# Patient Record
Sex: Male | Born: 1965 | Race: Black or African American | Hispanic: No | Marital: Married | Smoking: Never smoker
Health system: Southern US, Community
[De-identification: ages and names within clinical notes are randomized; demographics above are authoritative.]

## PROBLEM LIST (undated history)

## (undated) DIAGNOSIS — E119 Type 2 diabetes mellitus without complications: Secondary | ICD-10-CM

## (undated) DIAGNOSIS — R569 Unspecified convulsions: Secondary | ICD-10-CM

## (undated) DIAGNOSIS — E785 Hyperlipidemia, unspecified: Secondary | ICD-10-CM

## (undated) DIAGNOSIS — J45909 Unspecified asthma, uncomplicated: Secondary | ICD-10-CM

## (undated) DIAGNOSIS — D320 Benign neoplasm of cerebral meninges: Secondary | ICD-10-CM

## (undated) HISTORY — PX: OTHER SURGICAL HISTORY: SHX169

---

## 2013-05-31 ENCOUNTER — Encounter (HOSPITAL_COMMUNITY): Payer: Self-pay | Admitting: Emergency Medicine

## 2013-05-31 ENCOUNTER — Emergency Department (INDEPENDENT_AMBULATORY_CARE_PROVIDER_SITE_OTHER)
Admission: EM | Admit: 2013-05-31 | Discharge: 2013-05-31 | Disposition: A | Discharge status: Home / Self Care | Payer: Self-pay | Source: Home / Self Care

## 2013-05-31 DIAGNOSIS — T148XXA Other injury of unspecified body region, initial encounter: Secondary | ICD-10-CM

## 2013-05-31 DIAGNOSIS — R51 Headache: Secondary | ICD-10-CM

## 2013-05-31 DIAGNOSIS — R519 Headache, unspecified: Secondary | ICD-10-CM
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
## 2013-06-02 NOTE — ED Provider Notes (Signed)
Medical screening examination/treatment/procedure(s) were performed by a resident physician or non-physician practitioner and as the supervising physician I was immediately available for consultation/collaboration.  Clementeen GrahamEvan Akita Maxim, MD   Rodolph BongEvan S Zamauri Nez, MD 06/02/13 [phone]

## 2013-06-12 ENCOUNTER — Emergency Department (HOSPITAL_COMMUNITY)
Admission: EM | Admit: 2013-06-12 | Discharge: 2013-06-12 | Disposition: A | Discharge status: Home / Self Care | Payer: 59 | Source: Home / Self Care

## 2013-06-12 ENCOUNTER — Encounter (HOSPITAL_COMMUNITY): Payer: Self-pay | Admitting: Emergency Medicine

## 2013-06-12 DIAGNOSIS — R059 Cough, unspecified: Secondary | ICD-10-CM

## 2013-06-12 DIAGNOSIS — R05 Cough: Secondary | ICD-10-CM

## 2013-06-12 DIAGNOSIS — R0982 Postnasal drip: Secondary | ICD-10-CM

## 2013-06-12 MED ORDER — GUAIFENESIN-CODEINE 100-10 MG/5ML PO SYRP
5.0000 mL | ORAL_SOLUTION | ORAL | Status: DC | PRN
Start: 2013-06-12 — End: 2019-04-09

## 2013-06-12 MED ORDER — ALBUTEROL SULFATE HFA 108 (90 BASE) MCG/ACT IN AERS: 1.0000 | INHALATION_SPRAY | Freq: Four times a day (QID) | RESPIRATORY_TRACT | Status: AC | PRN

## 2013-06-12 NOTE — ED Notes (Signed)
Pt  Reports  Symptoms  Of  A  Non  Productive  Cough  With  The  Symptoms   X  1  Week       He  Reports  Some   Sinus  Congestion  As  Well   Symptoms  Not  releived  By  otc  meds

## 2013-06-12 NOTE — ED Provider Notes (Signed)
  CSN: [id number]     Arrival date & time 06/12/13  1209 History   First MD Initiated Contact with Patient 06/12/13 1402     Chief Complaint  Patient presents with  . Cough   (Consider location/radiation/quality/duration/timing/severity/associated sxs/prior Treatment) HPI Comments: Cough for 2 weeks. Early AM with cough and expectorating mucous. Cough worse at night when supine. No fever or dyspnea.   History reviewed. No pertinent past medical history. History reviewed. No pertinent past surgical history. History reviewed. No pertinent family history. History  Substance Use Topics  . Smoking status: Never Smoker   . Smokeless tobacco: Not on file  . Alcohol Use: No    Review of Systems  Constitutional: Negative for fever, diaphoresis, activity change and fatigue.  HENT: Positive for postnasal drip. Negative for ear pain, facial swelling, rhinorrhea, sore throat and trouble swallowing.   Eyes: Negative for pain, discharge and redness.  Respiratory: Positive for cough. Negative for chest tightness and shortness of breath.   Cardiovascular: Negative.   Gastrointestinal: Negative.   Musculoskeletal: Negative.  Negative for neck pain and neck stiffness.  Neurological: Negative.     Allergies  Review of patient's allergies indicates no known allergies.  Home Medications   Prior to Admission medications   Medication Sig Start Date End Date Taking? Authorizing Provider  ibuprofen (ADVIL,MOTRIN) 200 MG tablet Take 200 mg by mouth every 6 (six) hours as needed for headache.    Historical Provider, MD   BP 146/88  Pulse 98  Resp 17  SpO2 99% Physical Exam  Nursing note and vitals reviewed. Constitutional: He is oriented to person, place, and time. He appears well-developed and well-nourished. No distress.  HENT:  Mouth/Throat: No oropharyngeal exudate.  TM's nl OP with cobblestoning and clear PND  Eyes: Conjunctivae and EOM are normal.  Neck: Normal range of motion. Neck  supple.  Cardiovascular: Normal rate and regular rhythm.   Pulmonary/Chest: Effort normal and breath sounds normal. No respiratory distress. He has no wheezes. He has no rales.  Exp phase with slight prolongation.  Musculoskeletal: Normal range of motion. He exhibits no edema.  Lymphadenopathy:    He has no cervical adenopathy.  Neurological: He is alert and oriented to person, place, and time.  Skin: Skin is warm and dry. No rash noted.  Psychiatric: He has a normal mood and affect.    ED Course  Procedures (including critical care time) Labs Review Labs Reviewed - No data to display  Imaging Review No results found.   MDM   1. Cough   2. PND (post-nasal drip)      Cough most likely due to PND, also consider occult bronchospasm. cheratussin AC Allegra or chlortimeton aths Albuterol HFAas dir.    Hayden Rasmussenavid Jasyah Theurer, NP 06/12/13 1426

## 2013-06-12 NOTE — Discharge Instructions (Signed)
Cough, Adult  A cough is a reflex. It helps you clear your throat and airways. A cough can help heal your body. A cough can last 2 or 3 weeks (acute) or may last more than 8 weeks (chronic). Some common causes of a cough can include an infection, allergy, or a cold. HOME CARE  Only take medicine as told by your doctor.  If given, take your medicines (antibiotics) as told. Finish them even if you start to feel better.  Use a cold steam vaporizer or humidier in your home. This can help loosen thick spit (secretions).  Sleep so you are almost sitting up (semi-upright). Use pillows to do this. This helps reduce coughing.  Rest as needed.  Stop smoking if you smoke. GET HELP RIGHT AWAY IF:  You have yellowish-white fluid (pus) in your thick spit.  Your cough gets worse.  Your medicine does not reduce coughing, and you are losing sleep.  You cough up blood.  You have trouble breathing.  Your pain gets worse and medicine does not help.  You have a fever. MAKE SURE YOU:   Understand these instructions.  Will watch your condition.  Will get help right away if you are not doing well or get worse. Document Released: 09/23/2010 Document Revised: 04/04/2011 Document Reviewed: 09/23/2010 [employer] Patient Information 2014 [employer].  

## 2013-06-13 NOTE — ED Provider Notes (Signed)
Medical screening examination/treatment/procedure(s) were performed by a resident physician or non-physician practitioner and as the supervising physician I was immediately available for consultation/collaboration.  Amore Ackman, MD    Rolondo Pierre S Ardith Test, MD 06/13/13 0748 

## 2013-12-26 ENCOUNTER — Encounter (HOSPITAL_COMMUNITY): Payer: Self-pay | Admitting: *Deleted

## 2013-12-26 ENCOUNTER — Emergency Department (HOSPITAL_COMMUNITY)
Admission: EM | Admit: 2013-12-26 | Discharge: 2013-12-26 | Disposition: A | Discharge status: Home / Self Care | Payer: 59 | Source: Home / Self Care | Attending: Emergency Medicine | Admitting: Emergency Medicine

## 2013-12-26 DIAGNOSIS — J00 Acute nasopharyngitis [common cold]: Secondary | ICD-10-CM

## 2013-12-26 DIAGNOSIS — J069 Acute upper respiratory infection, unspecified: Secondary | ICD-10-CM

## 2013-12-26 DIAGNOSIS — B9789 Other viral agents as the cause of diseases classified elsewhere: Secondary | ICD-10-CM

## 2013-12-26 NOTE — Discharge Instructions (Signed)
Antibiotic Resistance Antibiotics are drugs. They fight infections caused by bacteria. Antibiotics greatly reduce illness and death from infectious diseases. Over time, the bacteria that antibiotics once controlled are much harder to kill. CAUSES  Antibiotic resistance occurs when bacteria change in some way. These changes can lessen the abilities of drugs designed to cure infections. The overuse of antibiotics can cause antibiotic resistance. Almost all important bacterial infections in the world are becoming resistant to drugs. Antibiotic resistance has been called one of the world's most pressing public health problems.  Antibiotics should be used to treat bacterial infections. But they are not effective against viral infections. These include the common cold, most sore throats, and the flu. Smart use of antibiotics will control the spread of resistance.  TREATMENT   Only use antibiotics as prescribed by your caregiver.  Talk with your caregiver about antibiotic resistance.  Ask what else you can do to feel better.  Do not take an antibiotic for a viral infection. This could be a cold, cough, or the flu.  Do not save some of your antibiotic for the next time you get sick.  Take an antibiotic exactly as the caregiver tells you.  Do not take an antibiotic that is prescribed for someone else.  Use the antibiotic as directed. Take the correct dose at the scheduled time. SEEK MEDICAL CARE IF:  You react to the antibiotic with:  A rash.  Itching.  An upset stomach. Document Released: 04/02/2002 Document Revised: 05/27/2013 Document Reviewed: 11/05/2007 [facility] Patient Information 2015 [place], [place]. This information is not intended to replace advice given to you by your health care provider. Make sure you discuss any questions you have with your health care provider.  Cough, Adult  A cough is a reflex that helps clear your throat and airways. It can help heal the body or may be a  reaction to an irritated airway. A cough may only last 2 or 3 weeks (acute) or may last more than 8 weeks (chronic).  CAUSES Acute cough:  Viral or bacterial infections. Chronic cough:  Infections.  Allergies.  Asthma.  Post-nasal drip.  Smoking.  Heartburn or acid reflux.  Some medicines.  Chronic lung problems (COPD).  Cancer. SYMPTOMS   Cough.  Fever.  Chest pain.  Increased breathing rate.  High-pitched whistling sound when breathing (wheezing).  Colored mucus that you cough up (sputum). TREATMENT   A bacterial cough may be treated with antibiotic medicine.  A viral cough must run its course and will not respond to antibiotics.  Your caregiver may recommend other treatments if you have a chronic cough. HOME CARE INSTRUCTIONS   Only take over-the-counter or prescription medicines for pain, discomfort, or fever as directed by your caregiver. Use cough suppressants only as directed by your caregiver.  Use a cold steam vaporizer or humidifier in your bedroom or home to help loosen secretions.  Sleep in a semi-upright position if your cough is worse at night.  Rest as needed.  Stop smoking if you smoke. SEEK IMMEDIATE MEDICAL CARE IF:   You have pus in your sputum.  Your cough starts to worsen.  You cannot control your cough with suppressants and are losing sleep.  You begin coughing up blood.  You have difficulty breathing.  You develop pain which is getting worse or is uncontrolled with medicine.  You have a fever. MAKE SURE YOU:   Understand these instructions.  Will watch your condition.  Will get help right away if you are not  doing well or get worse. Document Released: 07/09/2010 Document Revised: 04/04/2011 Document Reviewed: 07/09/2010 [facility] Patient Information 2015 [place]. This information is not intended to replace advice given to you by your health care provider. Make sure you discuss any questions you have with your  health care provider.  Upper Respiratory Infection, Adult An upper respiratory infection (URI) is also sometimes known as the common cold. The upper respiratory tract includes the nose, sinuses, throat, trachea, and bronchi. Bronchi are the airways leading to the lungs. Most people improve within 1 week, but symptoms can last up to 2 weeks. A residual cough may last even longer.  CAUSES Many different viruses can infect the tissues lining the upper respiratory tract. The tissues become irritated and inflamed and often become very moist. Mucus production is also common. A cold is contagious. You can easily spread the virus to others by oral contact. This includes kissing, sharing a glass, coughing, or sneezing. Touching your mouth or nose and then touching a surface, which is then touched by another person, can also spread the virus. SYMPTOMS  Symptoms typically develop 1 to 3 days after you come in contact with a cold virus. Symptoms vary from person to person. They may include:  Runny nose.  Sneezing.  Nasal congestion.  Sinus irritation.  Sore throat.  Loss of voice (laryngitis).  Cough.  Fatigue.  Muscle aches.  Loss of appetite.  Headache.  Low-grade fever. DIAGNOSIS  You might diagnose your own cold based on familiar symptoms, since most people get a cold 2 to 3 times a year. Your caregiver can confirm this based on your exam. Most importantly, your caregiver can check that your symptoms are not due to another disease such as strep throat, sinusitis, pneumonia, asthma, or epiglottitis. Blood tests, throat tests, and X-rays are not necessary to diagnose a common cold, but they may sometimes be helpful in excluding other more serious diseases. Your caregiver will decide if any further tests are required. RISKS AND COMPLICATIONS  You may be at risk for a more severe case of the common cold if you smoke cigarettes, have chronic heart disease (such as heart failure) or lung disease  (such as asthma), or if you have a weakened immune system. The very young and very old are also at risk for more serious infections. Bacterial sinusitis, middle ear infections, and bacterial pneumonia can complicate the common cold. The common cold can worsen asthma and chronic obstructive pulmonary disease (COPD). Sometimes, these complications can require emergency medical care and may be life-threatening. PREVENTION  The best way to protect against getting a cold is to practice good hygiene. Avoid oral or hand contact with people with cold symptoms. Wash your hands often if contact occurs. There is no clear evidence that vitamin C, vitamin E, echinacea, or exercise reduces the chance of developing a cold. However, it is always recommended to get plenty of rest and practice good nutrition. TREATMENT  Treatment is directed at relieving symptoms. There is no cure. Antibiotics are not effective, because the infection is caused by a virus, not by bacteria. Treatment may include:  Increased fluid intake. Sports drinks offer valuable electrolytes, sugars, and fluids.  Breathing heated mist or steam (vaporizer or shower).  Eating chicken soup or other clear broths, and maintaining good nutrition.  Getting plenty of rest.  Using gargles or lozenges for comfort.  Controlling fevers with ibuprofen or acetaminophen as directed by your caregiver.  Increasing usage of your inhaler if you have asthma.  Zinc gel and zinc lozenges, taken in the first 24 hours of the common cold, can shorten the duration and lessen the severity of symptoms. Pain medicines may help with fever, muscle aches, and throat pain. A variety of non-prescription medicines are available to treat congestion and runny nose. Your caregiver can make recommendations and may suggest nasal or lung inhalers for other symptoms.  HOME CARE INSTRUCTIONS   Only take over-the-counter or prescription medicines for pain, discomfort, or fever as  directed by your caregiver.  Use a warm mist humidifier or inhale steam from a shower to increase air moisture. This may keep secretions moist and make it easier to breathe.  Drink enough water and fluids to keep your urine clear or pale yellow.  Rest as needed.  Return to work when your temperature has returned to normal or as your caregiver advises. You may need to stay home longer to avoid infecting others. You can also use a face mask and careful hand washing to prevent spread of the virus. SEEK MEDICAL CARE IF:   After the first few days, you feel you are getting worse rather than better.  You need your caregiver's advice about medicines to control symptoms.  You develop chills, worsening shortness of breath, or brown or red sputum. These may be signs of pneumonia.  You develop yellow or brown nasal discharge or pain in the face, especially when you bend forward. These may be signs of sinusitis.  You develop a fever, swollen neck glands, pain with swallowing, or white areas in the back of your throat. These may be signs of strep throat. SEEK IMMEDIATE MEDICAL CARE IF:   You have a fever.  You develop severe or persistent headache, ear pain, sinus pain, or chest pain.  You develop wheezing, a prolonged cough, cough up blood, or have a change in your usual mucus (if you have chronic lung disease).  You develop sore muscles or a stiff neck. Document Released: 07/06/2000 Document Revised: 04/04/2011 Document Reviewed: 04/17/2013 [facility] Patient Information 2015 [place], [place]. This information is not intended to replace advice given to you by your health care provider. Make sure you discuss any questions you have with your health care provider.

## 2013-12-26 NOTE — ED Notes (Signed)
pT  REPORTS SYMPTOMS OF  COUGH  /  CONGESTION    BODY  ACHES      RUNNY NOSE               WITH  ONSET  X  2  DAYS    PT      SITTING  UPRIGHT  ON  EXAM TABLE SPEAKING  IN  COMPLETE  SENTANCES  AND  IS IN NO  SEVERE  DISTRESS

## 2013-12-26 NOTE — ED Provider Notes (Signed)
CSN: [id number]     Arrival date & time 12/26/13  [id number] History   First MD Initiated Contact with Patient 12/26/13 [phone]     Chief Complaint  Patient presents with  . URI   (Consider location/radiation/quality/duration/timing/severity/associated sxs/prior Treatment) HPI             48 year old male presents complaining of a cold. He has symptoms for 2 days. He has cough, congestion, rhinorrhea, slight sore throat. He also has a slight burning sensation in his eyes. He feels like all of his symptoms are getting better today. Denies fever, chills, NVD, chest pain, shortness of breath. No recent travel or sick contacts.  History reviewed. No pertinent past medical history. History reviewed. No pertinent past surgical history. History reviewed. No pertinent family history. History  Substance Use Topics  . Smoking status: Never Smoker   . Smokeless tobacco: Not on file  . Alcohol Use: No    Review of Systems  Constitutional: Negative for fever and chills.  HENT: Positive for congestion, rhinorrhea and sore throat. Negative for ear pain, postnasal drip, sinus pressure and trouble swallowing.   Respiratory: Positive for cough. Negative for chest tightness, shortness of breath and wheezing.   Cardiovascular: Negative for chest pain and palpitations.  Gastrointestinal: Negative for nausea, vomiting, abdominal pain and diarrhea.  All other systems reviewed and are negative.   Allergies  Review of patient's allergies indicates no known allergies.  Home Medications   Prior to Admission medications   Medication Sig Start Date End Date Taking? Authorizing Provider  albuterol (PROVENTIL HFA;VENTOLIN HFA) 108 (90 BASE) MCG/ACT inhaler Inhale 1-2 puffs into the lungs every 6 (six) hours as needed for wheezing or shortness of breath. 06/12/13   Hayden Rasmussenavid Mabe, NP  guaiFENesin-codeine (CHERATUSSIN AC) 100-10 MG/5ML syrup Take 5 mLs by mouth every 4 (four) hours as needed for cough or congestion. 06/12/13    Hayden Rasmussenavid Mabe, NP  ibuprofen (ADVIL,MOTRIN) 200 MG tablet Take 200 mg by mouth every 6 (six) hours as needed for headache.    Historical Provider, MD   BP 128/77 mmHg  Pulse 65  Temp(Src) 99.5 F (37.5 C) (Oral)  Resp 18  SpO2 98% Physical Exam  Constitutional: He is oriented to person, place, and time. He appears well-developed and well-nourished. No distress.  HENT:  Head: Normocephalic and atraumatic.  Right Ear: External ear normal.  Left Ear: External ear normal.  Nose: Nose normal.  Mouth/Throat: Oropharynx is clear and moist. No oropharyngeal exudate.  Eyes: Conjunctivae are normal. Right eye exhibits no discharge. Left eye exhibits no discharge.  Neck: Normal range of motion. Neck supple.  Cardiovascular: Normal rate, regular rhythm and normal heart sounds.   Pulmonary/Chest: Effort normal and breath sounds normal. No respiratory distress.  Lymphadenopathy:    He has no cervical adenopathy.  Neurological: He is alert and oriented to person, place, and time. Coordination normal.  Skin: Skin is warm and dry. No rash noted. He is not diaphoretic.  Psychiatric: He has a normal mood and affect. Judgment normal.  Nursing note and vitals reviewed.   ED Course  Procedures (including critical care time) Labs Review Labs Reviewed - No data to display  Imaging Review No results found.   MDM   1. Acute nasopharyngitis (common cold)   2. Viral URI with cough    Exam is benign/normal. Vitals are normal. Patient is afebrile and nontoxic. Treat symptomatically for viral upper respiratory infection. Follow-up when necessary. Advised DayQuil and NyQuil for symptoms  Graylon GoodZachary H Dontay Harm, PA-C 12/26/13 [phone]

## 2016-01-31 ENCOUNTER — Encounter (HOSPITAL_COMMUNITY): Payer: Self-pay | Admitting: Emergency Medicine

## 2016-01-31 ENCOUNTER — Ambulatory Visit (HOSPITAL_COMMUNITY)
Admission: EM | Admit: 2016-01-31 | Discharge: 2016-01-31 | Disposition: A | Discharge status: Home / Self Care | Payer: Commercial Managed Care - HMO | Attending: Family Medicine | Admitting: Family Medicine

## 2016-01-31 DIAGNOSIS — R05 Cough: Secondary | ICD-10-CM

## 2016-01-31 DIAGNOSIS — R6889 Other general symptoms and signs: Secondary | ICD-10-CM

## 2016-01-31 DIAGNOSIS — R059 Cough, unspecified: Secondary | ICD-10-CM

## 2016-01-31 MED ORDER — AZITHROMYCIN 250 MG PO TABS: 250.0000 mg | ORAL_TABLET | Freq: Every day | ORAL | 0 refills | Status: DC

## 2016-01-31 MED ORDER — BENZONATATE 100 MG PO CAPS: 200.0000 mg | ORAL_CAPSULE | Freq: Three times a day (TID) | ORAL | 0 refills | Status: DC | PRN

## 2016-01-31 MED ORDER — OSELTAMIVIR PHOSPHATE 75 MG PO CAPS: 75.0000 mg | ORAL_CAPSULE | Freq: Two times a day (BID) | ORAL | 0 refills | Status: DC

## 2016-01-31 NOTE — ED Triage Notes (Signed)
PT reports fever, sore throat, sweats, and watery eyes that started Friday.

## 2016-01-31 NOTE — ED Provider Notes (Signed)
CSN: [id number]     Arrival date & time 01/31/16  1713 History   First MD Initiated Contact with Patient 01/31/16 1749     Chief Complaint  Patient presents with  . Influenza   (Consider location/radiation/quality/duration/timing/severity/associated sxs/prior Treatment) Patient c/o fever sore throat and uri sx's for 2 days   The history is provided by the patient.  Influenza  Presenting symptoms: cough and sore throat   Severity:  Moderate Onset quality:  Sudden Duration:  2 days Progression:  Worsening Chronicity:  New Relieved by:  None tried Worsened by:  Nothing Ineffective treatments:  None tried Associated symptoms: chills and nasal congestion     History reviewed. No pertinent past medical history. History reviewed. No pertinent surgical history. No family history on file. Social History  Substance Use Topics  . Smoking status: Never Smoker  . Smokeless tobacco: Never Used  . Alcohol use No    Review of Systems  Constitutional: Positive for chills.  HENT: Positive for congestion and sore throat.   Eyes: Negative.   Respiratory: Positive for cough.   Cardiovascular: Negative.   Gastrointestinal: Negative.   Endocrine: Negative.   Genitourinary: Negative.   Musculoskeletal: Negative.   Allergic/Immunologic: Negative.   Neurological: Negative.     Allergies  Patient has no known allergies.  Home Medications   Prior to Admission medications   Medication Sig Start Date End Date Taking? Authorizing Provider  albuterol (PROVENTIL HFA;VENTOLIN HFA) 108 (90 BASE) MCG/ACT inhaler Inhale 1-2 puffs into the lungs every 6 (six) hours as needed for wheezing or shortness of breath. 06/12/13   Hayden Rasmussen, NP  azithromycin (ZITHROMAX) 250 MG tablet Take 1 tablet (250 mg total) by mouth daily. Take first 2 tablets together, then 1 every day until finished. 01/31/16   Deatra Canter, FNP  benzonatate (TESSALON) 100 MG capsule Take 2 capsules (200 mg total) by mouth 3  (three) times daily as needed for cough. 01/31/16   Deatra Canter, FNP  guaiFENesin-codeine (CHERATUSSIN AC) 100-10 MG/5ML syrup Take 5 mLs by mouth every 4 (four) hours as needed for cough or congestion. 06/12/13   Hayden Rasmussen, NP  ibuprofen (ADVIL,MOTRIN) 200 MG tablet Take 200 mg by mouth every 6 (six) hours as needed for headache.    Historical Provider, MD  oseltamivir (TAMIFLU) 75 MG capsule Take 1 capsule (75 mg total) by mouth every 12 (twelve) hours. 01/31/16   Deatra Canter, FNP   Meds Ordered and Administered this Visit  Medications - No data to display  BP 161/94   Pulse 60   Temp 99.7 F (37.6 C) (Oral)   Resp 16   Ht 5\' 11"  (1.803 m)   Wt 247 lb (112 kg)   SpO2 96%   BMI 34.45 kg/m  No data found.   Physical Exam  Constitutional: He appears well-developed and well-nourished.  HENT:  Head: Normocephalic and atraumatic.  Right Ear: External ear normal.  Left Ear: External ear normal.  Mouth/Throat: Oropharynx is clear and moist.  Eyes: EOM are normal. Pupils are equal, round, and reactive to light.  Neck: Normal range of motion. Neck supple.  Cardiovascular: Normal rate, regular rhythm and normal heart sounds.   Pulmonary/Chest: Effort normal and breath sounds normal.  Abdominal: Soft.  Nursing note and vitals reviewed.   Urgent Care Course   Clinical Course     Procedures (including critical care time)  Labs Review Labs Reviewed - No data to display  Imaging Review No results found.  Visual Acuity Review  Right Eye Distance:   Left Eye Distance:   Bilateral Distance:    Right Eye Near:   Left Eye Near:    Bilateral Near:         MDM   1. Flu-like symptoms   2. Cough   zpak tamiflu Tessalon Push po fluids, rest, tylenol and motrin otc prn as directed for fever, arthralgias, and myalgias.  Follow up prn if sx's continue or persist.   Deatra CanterWilliam J Taziyah Iannuzzi, FNP 01/31/16 [phone]

## 2016-09-05 ENCOUNTER — Ambulatory Visit (HOSPITAL_COMMUNITY)
Admission: EM | Admit: 2016-09-05 | Discharge: 2016-09-05 | Disposition: A | Discharge status: Home / Self Care | Payer: 59 | Attending: Family Medicine | Admitting: Family Medicine

## 2016-09-05 ENCOUNTER — Encounter (HOSPITAL_COMMUNITY): Payer: Self-pay | Admitting: *Deleted

## 2016-09-05 DIAGNOSIS — B349 Viral infection, unspecified: Secondary | ICD-10-CM

## 2016-09-05 MED ORDER — IBUPROFEN 800 MG PO TABS
ORAL_TABLET | ORAL | Status: AC
  Filled 2016-09-05: qty 1

## 2016-09-05 MED ORDER — IBUPROFEN 800 MG PO TABS
800.0000 mg | ORAL_TABLET | Freq: Once | ORAL | Status: AC
  Administered 2016-09-05: 800 mg via ORAL
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
## 2016-09-07 NOTE — ED Provider Notes (Signed)
  [facility]   [id number] 09/05/16 Arrival Time: 1414  ASSESSMENT & PLAN:  1. Viral illness     Meds ordered this encounter  Medications  . ibuprofen (ADVIL,MOTRIN) tablet 800 mg   Rest and fluids. Symptom care. F/U as needed.  Reviewed expectations re: course of current medical issues. Questions answered. Outlined signs and symptoms indicating need for more acute intervention. Patient verbalized understanding. After Visit Summary given.   SUBJECTIVE:  Jeremy Osborn is a 51 y.o. male who presents with complaint of cold symptoms since yesterday. Acute onset. Body aches and fatigue. Subjective fever and chills. Mild cough without SOB. Nasal congestion. No n/v/d. No sick contacts. No OTC treatment.  ROS: As per HPI.   OBJECTIVE:  Vitals:   09/05/16 1459  BP: 122/70  Pulse: 70  Resp: 18  Temp: (!) 101.2 F (38.4 C)  TempSrc: Oral  SpO2: 100%    General appearance: alert; no distress; appears fatigued; febrile HEENT: nasal congestion; clear runny nose; conjunctivae normal; TMs normal Neck: supple Lungs: clear to auscultation bilaterally; occasional cough Skin: warm and dry Psychological:  alert and cooperative; normal mood and affect  No Known Allergies  PMHx, SurgHx, SocialHx, Medications, and Allergies were reviewed in the Visit Navigator and updated as appropriate.       Mardella LaymanHagler, Shakti Fleer, MD 09/07/16 1006

## 2017-07-02 ENCOUNTER — Emergency Department (HOSPITAL_BASED_OUTPATIENT_CLINIC_OR_DEPARTMENT_OTHER): Payer: 59

## 2017-07-02 ENCOUNTER — Other Ambulatory Visit: Payer: Self-pay

## 2017-07-02 ENCOUNTER — Encounter (HOSPITAL_BASED_OUTPATIENT_CLINIC_OR_DEPARTMENT_OTHER): Payer: Self-pay | Admitting: Emergency Medicine

## 2017-07-02 ENCOUNTER — Emergency Department (HOSPITAL_BASED_OUTPATIENT_CLINIC_OR_DEPARTMENT_OTHER)
Admission: EM | Admit: 2017-07-02 | Discharge: 2017-07-02 | Disposition: A | Discharge status: Home / Self Care | Payer: 59 | Attending: Emergency Medicine | Admitting: Emergency Medicine

## 2017-07-02 DIAGNOSIS — Z7984 Long term (current) use of oral hypoglycemic drugs: Secondary | ICD-10-CM | POA: Insufficient documentation

## 2017-07-02 DIAGNOSIS — R059 Cough, unspecified: Secondary | ICD-10-CM

## 2017-07-02 DIAGNOSIS — R05 Cough: Secondary | ICD-10-CM | POA: Diagnosis not present

## 2017-07-02 DIAGNOSIS — Z79899 Other long term (current) drug therapy: Secondary | ICD-10-CM | POA: Diagnosis not present

## 2017-07-02 DIAGNOSIS — R61 Generalized hyperhidrosis: Secondary | ICD-10-CM | POA: Insufficient documentation

## 2017-07-02 DIAGNOSIS — J385 Laryngeal spasm: Secondary | ICD-10-CM | POA: Diagnosis not present

## 2017-07-02 DIAGNOSIS — E119 Type 2 diabetes mellitus without complications: Secondary | ICD-10-CM | POA: Diagnosis not present

## 2017-07-02 DIAGNOSIS — J45909 Unspecified asthma, uncomplicated: Secondary | ICD-10-CM | POA: Insufficient documentation

## 2017-07-02 DIAGNOSIS — R0602 Shortness of breath: Secondary | ICD-10-CM | POA: Diagnosis present

## 2017-07-02 HISTORY — DX: Type 2 diabetes mellitus without complications: E11.9

## 2017-07-02 HISTORY — DX: Hyperlipidemia, unspecified: E78.5

## 2017-07-02 HISTORY — DX: Unspecified asthma, uncomplicated: J45.909

## 2017-07-02 LAB — BASIC METABOLIC PANEL
ANION GAP: 11 (ref 5–15)
BUN: 12 mg/dL (ref 6–20)
CO2: 24 mmol/L (ref 22–32)
Calcium: 8.6 mg/dL — ABNORMAL LOW (ref 8.9–10.3)
Chloride: 103 mmol/L (ref 101–111)
Creatinine, Ser: 1.08 mg/dL (ref 0.61–1.24)
GFR calc Af Amer: >60 mL/min (ref >60)
Glucose, Bld: 114 mg/dL — ABNORMAL HIGH (ref 65–99)
POTASSIUM: 3.7 mmol/L (ref 3.5–5.1)
Sodium: 138 mmol/L (ref 135–145)

## 2017-07-02 LAB — CBC
HCT: 42.7 % (ref 39.0–52.0)
Hemoglobin: 14.3 g/dL (ref 13.0–17.0)
MCH: 29.6 pg (ref 26.0–34.0)
MCHC: 33.5 g/dL (ref 30.0–36.0)
MCV: 88.4 fL (ref 78.0–100.0)
PLATELETS: 203 10*3/uL (ref 150–400)
RBC: 4.83 MIL/uL (ref 4.22–5.81)
RDW: 14 % (ref 11.5–15.5)
WBC: 4.4 10*3/uL (ref 4.0–10.5)

## 2017-07-02 LAB — TROPONIN I: Troponin I: <0.03 ng/mL (ref <0.03)

## 2017-07-02 MED ORDER — LIDOCAINE VISCOUS HCL 2 % MT SOLN: 15.0000 mL | Freq: Four times a day (QID) | OROMUCOSAL | 0 refills | Status: DC | PRN

## 2017-07-02 MED ORDER — LIDOCAINE HCL (PF) 2 % IJ SOLN
INTRAMUSCULAR | Status: AC
  Administered 2017-07-02: 200 mg
  Filled 2017-07-02: qty 2

## 2017-07-02 MED ORDER — LIDOCAINE HCL 4 % EX SOLN
CUTANEOUS | Status: AC
  Filled 2017-07-02: qty 50

## 2017-07-02 MED ORDER — LIDOCAINE HCL (PF) 4 % IJ SOLN
5.0000 mL | Freq: Once | INTRAMUSCULAR | Status: DC
  Filled 2017-07-02: qty 5

## 2017-07-02 MED ORDER — LIDOCAINE HCL 2 % IJ SOLN
10.0000 mL | Freq: Once | INTRAMUSCULAR | Status: AC
  Administered 2017-07-02: 200 mg
  Filled 2017-07-02: qty 10

## 2017-07-02 MED ORDER — LIDOCAINE HCL (PF) 2 % IJ SOLN
INTRAMUSCULAR | Status: AC
  Filled 2017-07-02: qty 2

## 2017-07-02 NOTE — ED Triage Notes (Signed)
Reports "episodes of not being able to catch my breath". Reports that some instances have caused him to urinate on himself. Wife states when this happens he is diaphoretic, and that sometimes it happens when he is asleep. Pt states this has been going on "for a while" but has dramatically increased in frequency.

## 2017-07-02 NOTE — ED Notes (Signed)
ED Provider at bedside. 

## 2017-07-02 NOTE — Discharge Instructions (Signed)
Please read and follow all provided instructions.  Your diagnoses today include:  1. Laryngospasms   2. Cough     Tests performed today include:  An EKG of your heart  A chest x-ray - shows question of pneumonia but your exam and bloodwork does not support you having a pneumonia  Cardiac enzymes - a blood test for heart muscle damage  Blood counts and electrolytes  Vital signs. See below for your results today.   Medications prescribed:   Lidocaine - if you have an episode with residual throat irritation, take this medication to help with symptoms  Take any prescribed medications only as directed.  Follow-up instructions: Please follow-up with your primary care provider as soon as you can for further evaluation of your symptoms.   Follow-up with the lung doctor listed for further evaluation.   Return instructions:   If you are choking, call 9-1-1.   If you have worsening pain in your chest, trouble breathing, or fever, please return for recheck.   Additional Information: Chest pain comes from many different causes. Your caregiver has diagnosed you as having chest pain that is not specific for one problem, but does not require admission.  You are at low risk for an acute heart condition or other serious illness.   Your vital signs today were: BP 125/86    Pulse (!) 58    Temp 99.2 F (37.3 C)    Resp 14    SpO2 100%  If your blood pressure (BP) was elevated above 135/85 this visit, please have this repeated by your doctor within one month. --------------

## 2017-07-02 NOTE — ED Provider Notes (Signed)
[facility] EMERGENCY DEPARTMENT Provider Note   CSN: [id number] Arrival date & time: 07/02/17  1946     History   Chief Complaint Chief Complaint  Patient presents with  . Shortness of Breath    HPI Jeremy Osborn is a 52 y.o. male.  Patient presents to the emergency department today with acute episode of difficulty breathing.  Patient has had these episodes in the past.  Today his symptoms started as coughing and then became worse to the point where he felt like he could not breathe out.  Patient feels like he is choking during these episodes.  Wife reports that he breaks out into a sweat when they occur.  There may be a harsh or hoarse sound occurring when he is trying to breathe.  He did not have any associated chest pain, lightheadedness, or syncope.  Patient did not have any associated tongue or lip swelling, itchy rash, vomiting or diarrhea.  He does not have a history of anaphylaxis or other allergic reactions.  Symptoms then gradually resolved.  He has some residual hoarseness of his voice.  He does not report a history of reflux or heartburn.  Patient was recently started on new medications for diabetes.  However, these episodes have been occurring for several years but patient states that they have been occurring more frequently recently.  He has not seen anybody for the symptoms in the past. The onset of this condition was acute. The course is constant. Aggravating factors: none. Alleviating factors: none.       Past Medical History:  Diagnosis Date  . Asthma   . Diabetes mellitus without complication (HCC)   . Hyperlipidemia     There are no active problems to display for this patient.   History reviewed. No pertinent surgical history.      Home Medications    Prior to Admission medications   Medication Sig Start Date End Date Taking? Authorizing Provider  atorvastatin (LIPITOR) 10 MG tablet Take 20 mg by mouth daily.   Yes [provider]    glipiZIDE (GLUCOTROL) 5 MG tablet Take by mouth daily before breakfast.   Yes [provider]  metFORMIN (GLUCOPHAGE) 500 MG tablet Take by mouth 2 (two) times daily with a meal.   Yes [provider]  albuterol (PROVENTIL HFA;VENTOLIN HFA) 108 (90 BASE) MCG/ACT inhaler Inhale 1-2 puffs into the lungs every 6 (six) hours as needed for wheezing or shortness of breath. 06/12/13   Hayden Rasmussen, NP  azithromycin (ZITHROMAX) 250 MG tablet Take 1 tablet (250 mg total) by mouth daily. Take first 2 tablets together, then 1 every day until finished. 01/31/16   Deatra Canter, FNP  benzonatate (TESSALON) 100 MG capsule Take 2 capsules (200 mg total) by mouth 3 (three) times daily as needed for cough. 01/31/16   Deatra Canter, FNP  guaiFENesin-codeine (CHERATUSSIN AC) 100-10 MG/5ML syrup Take 5 mLs by mouth every 4 (four) hours as needed for cough or congestion. 06/12/13   Hayden Rasmussen, NP  ibuprofen (ADVIL,MOTRIN) 200 MG tablet Take 200 mg by mouth every 6 (six) hours as needed for headache.    [provider]  oseltamivir (TAMIFLU) 75 MG capsule Take 1 capsule (75 mg total) by mouth every 12 (twelve) hours. 01/31/16   Deatra Canter, FNP    Family History No family history on file.  Social History Social History   Tobacco Use  . Smoking status: Never Smoker  . Smokeless tobacco: Never Used  Substance Use Topics  . Alcohol use: No  . Drug use: No     Allergies   Patient has no known allergies.   Review of Systems Review of Systems  Constitutional: Negative for fever.  HENT: Positive for voice change. Negative for rhinorrhea and sore throat.   Eyes: Negative for redness.  Respiratory: Positive for cough and shortness of breath.   Cardiovascular: Negative for chest pain.  Gastrointestinal: Negative for abdominal pain, diarrhea, nausea and vomiting.  Genitourinary: Negative for dysuria.  Musculoskeletal: Negative for myalgias.  Skin: Negative for rash.   Neurological: Negative for headaches.     Physical Exam Updated Vital Signs BP (!) 151/69   Pulse (!) 52   Temp 99.2 F (37.3 C)   Resp 12   SpO2 100%   Physical Exam  Constitutional: He appears well-developed and well-nourished.  HENT:  Head: Normocephalic and atraumatic.  Mouth/Throat: Mucous membranes are normal. Mucous membranes are not dry.  Eyes: Conjunctivae are normal.  Neck: Trachea normal and normal range of motion. Neck supple. Normal carotid pulses and no JVD present. No muscular tenderness present. Carotid bruit is not present. No tracheal deviation present.  Cardiovascular: Normal rate, regular rhythm, S1 normal, S2 normal, normal heart sounds and intact distal pulses. Exam reveals no distant heart sounds and no decreased pulses.  No murmur heard. Pulmonary/Chest: Effort normal and breath sounds normal. No respiratory distress. He has no wheezes. He exhibits no tenderness.  Abdominal: Soft. Normal aorta and bowel sounds are normal. There is no tenderness. There is no rebound and no guarding.  Musculoskeletal: He exhibits no edema.  Neurological: He is alert.  Skin: Skin is warm and dry. He is not diaphoretic. No cyanosis. No pallor.  Psychiatric: He has a normal mood and affect.  Nursing note and vitals reviewed.    ED Treatments / Results  Labs (all labs ordered are listed, but only abnormal results are displayed) Labs Reviewed  BASIC METABOLIC PANEL - Abnormal; Notable for the following components:      Result Value   Glucose, Bld 114 (*)    Calcium 8.6 (*)    All other components within normal limits  CBC  TROPONIN I    EKG EKG Interpretation  Date/Time:  Sunday July 02 2017 22:33:59 EDT Ventricular Rate:  53 PR Interval:  196 QRS Duration: 89 QT Interval:  417 QTC Calculation: 392 R Axis:   12 Text Interpretation:  Sinus rhythm Borderline prolonged PR interval Probable left atrial enlargement ST elevation suggests acute pericarditis No  significant change since last tracing Confirmed by Derwood Kaplananavati, Ankit ([id number]) on 07/02/2017 11:30:31 PM   Radiology Dg Chest 2 View  Result Date: 07/02/2017 CLINICAL DATA:  Shortness of breath for 2 days. EXAM: CHEST - 2 VIEW COMPARISON:  None. FINDINGS: The heart size and mediastinal contours are within normal limits. The lung volumes are low. There is patchy consolidation of medial right lung base. There is no pleural effusion or pulmonary edema. The visualized skeletal structures are unremarkable. IMPRESSION: Mild patchy consolidation of medial right lung base developing pneumonia is not excluded. Electronically Signed   By: Sherian ReinWei-Chen  Lin M.D.   On: 07/02/2017 20:52    Procedures Procedures (including critical care time)  Medications Ordered in ED Medications  lidocaine (XYLOCAINE) 2 % injection (has no administration in time range)  lidocaine (XYLOCAINE) 2 % (with pres) injection 200 mg (200 mg Other Given 07/02/17 2227)     Initial Impression / Assessment and Plan / ED Course  I have reviewed the triage vital signs and the nursing notes.  Pertinent labs & imaging results that were available during my care of the patient were reviewed by me and considered in my medical decision making (see chart for details).     Patient seen and examined.  Unclear etiology of patient's symptoms.  Will evaluate heart and lungs with chest x-ray, troponin, labs.  EKG reviewed.  Vital signs reviewed and are as follows: BP (!) 151/69   Pulse (!) 52   Temp 99.2 F (37.3 C)   Resp 12   SpO2 100%   Cough, hoarseness, choking sounds most like possible laryngospasm.  Unclear etiology.  If work-up negative, will refer to pulmonary for possible further work-up.  10:00 PM Patient discussed with and seen by Dr. Rhunette Croft who has seen patient. He continues to have some symptoms. Nebulized lidocaine ordered by Dr. Rhunette Croft.   11:35 PM Pt felt some better with lidocaine.  He is ready for discharged home at this  point.  No respiratory distress.  Lungs are clear.  Encouraged follow-up with PCP and pulmonary -- referral given.   Patient has recurrent episodes, I discussed easy deep breathing.  If symptoms persist and he cannot breathe, family should call 911.  Viscous lidocaine given for residual symptoms as needed.  Final Clinical Impressions(s) / ED Diagnoses   Final diagnoses:  Laryngospasms  Cough   Patient with cough, voice change, short-lived episodes of difficulty breathing.  Unclear etiology, possible laryngeal spasm.  Patient clinically improved in the emergency department.  Treated symptomatically here with improvement.  Feel that he would benefit from pulmonary evaluation.  Referral given.  Patient appears well, nontoxic.  Chest x-ray with patchy infiltrate however patient does not have pre-existing cough, fever, leukocytosis to suggest a diagnosis of pneumonia and do not feel that patient should be treated for pneumonia at this time.   ED Discharge Orders        Ordered    lidocaine (XYLOCAINE) 2 % solution  Every 6 hours PRN     07/02/17 2325       Renne Crigler, PA-C 07/02/17 2340    Derwood Kaplan, MD 07/02/17 [id number]

## 2017-07-02 NOTE — ED Notes (Signed)
Patient transported to X-ray 

## 2017-07-02 NOTE — ED Notes (Signed)
Placed Pt on CAT of 27000ml/mg 2% Lidocane nebulized for I hr. Pt HR 40-50 when started. Will monitor

## 2017-07-03 ENCOUNTER — Encounter: Payer: Self-pay | Admitting: Pulmonary Disease

## 2017-07-03 ENCOUNTER — Telehealth: Payer: Self-pay | Admitting: Pulmonary Disease

## 2017-07-03 ENCOUNTER — Ambulatory Visit: Payer: 59 | Admitting: Pulmonary Disease

## 2017-07-03 VITALS — BP 140/90 | HR 59 | Ht 71.0 in | Wt 237.0 lb

## 2017-07-03 DIAGNOSIS — R059 Cough, unspecified: Secondary | ICD-10-CM

## 2017-07-03 DIAGNOSIS — R05 Cough: Secondary | ICD-10-CM | POA: Diagnosis not present

## 2017-07-03 MED ORDER — PREDNISONE 10 MG PO TABS: ORAL_TABLET | ORAL | 0 refills | Status: DC

## 2017-07-03 MED ORDER — DOXYCYCLINE HYCLATE 100 MG PO TABS: 100.0000 mg | ORAL_TABLET | Freq: Two times a day (BID) | ORAL | 0 refills | Status: DC

## 2017-07-03 NOTE — Telephone Encounter (Signed)
[place] location.  Nothing avail in [place] until July as well. States he can come any day at any time.

## 2017-07-03 NOTE — Patient Instructions (Signed)
Prednisone 10 mg pill >> 3 pills daily for 2 days, 2 pills daily for 2 days, 1 pill daily for 2 days Doxycyline 100 mg twice per day for 7 days Follow up in 2 weeks with Dr. Craige CottaSood or Nurse Practitioner You will need chest xray at follow up visit

## 2017-07-03 NOTE — Telephone Encounter (Signed)
Consult scheduled with VS at 3:45 today

## 2017-07-03 NOTE — Progress Notes (Signed)
[facility]  Chief Complaint  Patient presents with  . pulm consult    Pt has in ER at [facility] last night, could not breath well. Pt has productive cough-brown, some SOB with cough and exertion, sore throat, and cannot breath.     Vital signs: BP 140/90 (BP Location: Left Arm, Cuff Size: Normal)   Pulse (!) 59   Ht 5\' 11"  (1.803 m)   Wt 237 lb (107.5 kg)   SpO2 97%   BMI 33.05 kg/m   History of Present Illness: Jeremy Osborn is a 52 y.o. male with cough.  He has noticed trouble intermittently for the past few years.  He was told 2 years ago he has exercise asthma and he uses albuterol before he rides his bike.  His symptoms got worse few weeks ago.  He gets coughing spells, and then feels like his throat closes.  He gets anxious because he feels like he can't breath.  He will hear himself wheeze sometimes.  He has been coughing up brown phlegm for past week.  He wakes up at night sometimes feeling like he can't breath.  He is not having sinus congestion, sore throat, dysphagia, abdominal pain, reflux, skin rash, or swelling.    No history of pneumonia or TB.  Works in [employer].  No animal/bird exposure.  Doesn't smoke.  Never in [employer].  From [place], but has lived in [place] for about 20 years.   Physical Exam:  General - pleasant Eyes - pupils reactive ENT - no sinus tenderness, no oral exudate, no LAN Cardiac - regular, no murmur Chest - faint squeaks right mid lung field Abd - soft, non tender Ext - no edema Skin - no rashes Neuro - normal strength Psych - normal mood   CMP Latest Ref Rng & Units 07/02/2017  Glucose 65 - 99 mg/dL 161(W114(H)  BUN 6 - 20 mg/dL 12  Creatinine 9.600.61 - 4.541.24 mg/dL 0.981.08  Sodium 119135 - 147145 mmol/L 138  Potassium 3.5 - 5.1 mmol/L 3.7  Chloride 101 - 111 mmol/L 103  CO2 22 - 32 mmol/L 24  Calcium 8.9 - 10.3 mg/dL 8.2(N8.6(L)    CBC Latest Ref Rng & Units 07/02/2017  WBC 4.0 - 10.5  K/uL 4.4  Hemoglobin 13.0 - 17.0 g/dL 56.214.3  Hematocrit 13.039.0 - 52.0 % 42.7  Platelets 150 - 400 K/uL 203    Dg Chest 2 View  Result Date: 07/02/2017 CLINICAL DATA:  Shortness of breath for 2 days. EXAM: CHEST - 2 VIEW COMPARISON:  None. FINDINGS: The heart size and mediastinal contours are within normal limits. The lung volumes are low. There is patchy consolidation of medial right lung base. There is no pleural effusion or pulmonary edema. The visualized skeletal structures are unremarkable. IMPRESSION: Mild patchy consolidation of medial right lung base developing pneumonia is not excluded. Electronically Signed   By: Sherian ReinWei-Chen  Lin M.D.   On: 07/02/2017 20:52      Discussion: He has symptoms suggestive of intermittent cough.  He could have asthma.  He has progressive symptoms over past few weeks.  His CXR showed right middle lung field infiltrate which is consistent with exam findings for pneumonia.  He likely also has episodes of laryngospasm related to coughing spells.   Assessment/Plan:  Cough with pneumonia, laryngospasm, and possible asthma. - doxycycline, prednisone - f/u CXR at next visit >> if infiltrate persists, then will need CT chest - will need PFT  when more stable - might need sleep study to determine if some of nocturnal symptoms are related sleep apnea   Patient Instructions  Prednisone 10 mg pill >> 3 pills daily for 2 days, 2 pills daily for 2 days, 1 pill daily for 2 days Doxycyline 100 mg twice per day for 7 days Follow up in 2 weeks with Dr. Craige Cotta or Nurse Practitioner You will need chest xray at follow up visit    Coralyn Helling, MD [facility] Pulmonary/Critical Care 07/03/2017, 5:21 PM  Flow Sheet  Pulmonary tests:  Review of Systems: Constitutional: Negative for fever and unexpected weight change.  HENT: Positive for congestion, postnasal drip, sore throat and voice change. Negative for dental problem, ear pain, nosebleeds, rhinorrhea, sinus pressure,  sneezing and trouble swallowing.   Eyes: Negative for redness and itching.  Respiratory: Positive for cough and shortness of breath. Negative for chest tightness and wheezing.   Cardiovascular: Negative for palpitations and leg swelling.  Gastrointestinal: Negative for nausea and vomiting.  Genitourinary: Negative for dysuria.  Musculoskeletal: Negative for joint swelling.  Skin: Negative for rash.  Allergic/Immunologic: Negative.  Negative for environmental allergies, food allergies and immunocompromised state.  Neurological: Negative for headaches.  Hematological: Does not bruise/bleed easily.  Psychiatric/Behavioral: Negative for dysphoric mood. The patient is not nervous/anxious.    Past Medical History: He  has a past medical history of Asthma, Diabetes mellitus without complication (HCC), and Hyperlipidemia.  Past Surgical History: He has no prior surgeries  Family History: His family history includes Diabetes in his father and mother.  Social History: He  reports that he has never smoked. He has never used smokeless tobacco. He reports that he does not drink alcohol or use drugs.  Medications: Allergies as of 07/03/2017   No Known Allergies     Medication List        Accurate as of 07/03/17  5:21 PM. Always use your most recent med list.          albuterol 108 (90 Base) MCG/ACT inhaler Commonly known as:  PROVENTIL HFA;VENTOLIN HFA Inhale 1-2 puffs into the lungs every 6 (six) hours as needed for wheezing or shortness of breath.   atorvastatin 10 MG tablet Commonly known as:  LIPITOR Take 20 mg by mouth daily.   benzonatate 100 MG capsule Commonly known as:  TESSALON Take 2 capsules (200 mg total) by mouth 3 (three) times daily as needed for cough.   doxycycline 100 MG tablet Commonly known as:  VIBRA-TABS Take 1 tablet (100 mg total) by mouth 2 (two) times daily.   glipiZIDE 5 MG tablet Commonly known as:  GLUCOTROL Take by mouth daily before breakfast.    guaiFENesin-codeine 100-10 MG/5ML syrup Commonly known as:  CHERATUSSIN AC Take 5 mLs by mouth every 4 (four) hours as needed for cough or congestion.   ibuprofen 200 MG tablet Commonly known as:  ADVIL,MOTRIN Take 200 mg by mouth every 6 (six) hours as needed for headache.   lidocaine 2 % solution Commonly known as:  XYLOCAINE Use as directed 15 mLs in the mouth or throat every 6 (six) hours as needed for mouth pain.   metFORMIN 500 MG tablet Commonly known as:  GLUCOPHAGE Take by mouth 2 (two) times daily with a meal.   predniSONE 10 MG tablet Commonly known as:  DELTASONE 3 pills daily for 2 days, 2 pills daily for 2 days, 1 pill daily for 2 days

## 2017-07-03 NOTE — Progress Notes (Signed)
   Subjective:    Patient ID: Jeremy BlossomMark A Osborn, male    DOB: Jun 18, 1965, 52 y.o.   MRN: [id number]  HPI    Review of Systems  Constitutional: Negative for fever and unexpected weight change.  HENT: Positive for congestion, postnasal drip, sore throat and voice change. Negative for dental problem, ear pain, nosebleeds, rhinorrhea, sinus pressure, sneezing and trouble swallowing.   Eyes: Negative for redness and itching.  Respiratory: Positive for cough and shortness of breath. Negative for chest tightness and wheezing.   Cardiovascular: Negative for palpitations and leg swelling.  Gastrointestinal: Negative for nausea and vomiting.  Genitourinary: Negative for dysuria.  Musculoskeletal: Negative for joint swelling.  Skin: Negative for rash.  Allergic/Immunologic: Negative.  Negative for environmental allergies, food allergies and immunocompromised state.  Neurological: Negative for headaches.  Hematological: Does not bruise/bleed easily.  Psychiatric/Behavioral: Negative for dysphoric mood. The patient is not nervous/anxious.        Objective:   Physical Exam        Assessment & Plan:

## 2017-07-16 NOTE — Progress Notes (Signed)
 @Patient  ID: Jeremy Osborn, male    DOB: 1965/06/03, 52 y.o.   MRN: [id number]  Chief Complaint  Patient presents with  . Follow-up    States he is much better still has cough but mucous has decreased. States he still has some pain in his upper back but it is better.     Referring provider: Verdia KubaKumar, Mukesh, MD  HPI: 52 year old male patient with potential exercise-induced asthma, seen by Dr. Craige CottaSood on 07/03/2017 for initial pulmonary work-up.  Right medial lung pneumonia.  Treated with doxycycline.  Recent [facility] Encounters:  07/03/2017-initial/consult-Sood Patient was in the ER at [facility] last night could not breathe well.  Patient has productive cough/brown, some shortness of breath. Plan: cough with pneumonia, doxycycline and prednisone, follow-up chest x-ray at next visit, if infiltrate persists then will need to chest CT, get pulmonary function test when more stable, might need sleep study to determine if some nocturnal symptoms are related to sleep apnea    Tests:   Imaging:  07/02/2017-chest x-ray-mild patchy consolidation of medial right lung base developing pneumonia is not excluded   Cardiac:   Labs:   Micro:   Chart Review:    03/09/2017-chest x-ray-left basilar scarring or atelectasis office visit-pulmonary function test today  07/16/17 OV  Pleasant 52 year old patient seen in office today.  Patient reporting he feels much better since being seen by Dr. said 2 weeks ago.  Patient has completed his prednisone as well as his doxycycline.  Patient reports that he feels "normal as well as a sleeping fine.  Patient reporting he is only using his rescue inhaler prior to exercise on his bicycle.  This is about 3 times a week.  He has not needed any other times besides that.  Patient reports no other respiratory problems or concerns.  No Known Allergies  Immunization History  Administered Date(s) Administered  . Influenza-Unspecified 10/24/2016     Past Medical History:  Diagnosis Date  . Asthma   . Diabetes mellitus without complication (HCC)   . Hyperlipidemia     Tobacco History: Social History   Tobacco Use  Smoking Status Never Smoker  Smokeless Tobacco Never Used   Counseling given: Not Answered   Outpatient Encounter Medications as of 07/17/2017  Medication Sig  . albuterol (PROVENTIL HFA;VENTOLIN HFA) 108 (90 BASE) MCG/ACT inhaler Inhale 1-2 puffs into the lungs every 6 (six) hours as needed for wheezing or shortness of breath.  Marland Kitchen. atorvastatin (LIPITOR) 10 MG tablet Take 20 mg by mouth daily.  . benzonatate (TESSALON) 100 MG capsule Take 2 capsules (200 mg total) by mouth 3 (three) times daily as needed for cough.  Marland Kitchen. glipiZIDE (GLUCOTROL) 5 MG tablet Take by mouth daily before breakfast.  . guaiFENesin-codeine (CHERATUSSIN AC) 100-10 MG/5ML syrup Take 5 mLs by mouth every 4 (four) hours as needed for cough or congestion.  Marland Kitchen. ibuprofen (ADVIL,MOTRIN) 200 MG tablet Take 200 mg by mouth every 6 (six) hours as needed for headache.  . lidocaine (XYLOCAINE) 2 % solution Use as directed 15 mLs in the mouth or throat every 6 (six) hours as needed for mouth pain.  . metFORMIN (GLUCOPHAGE) 500 MG tablet Take by mouth 2 (two) times daily with a meal.  . [DISCONTINUED] benzonatate (TESSALON) 100 MG capsule Take 2 capsules (200 mg total) by mouth 3 (three) times daily as needed for cough.  . doxycycline (VIBRA-TABS) 100 MG tablet Take 1 tablet (100 mg total) by mouth 2 (two) times daily. (Patient  not taking: Reported on 07/17/2017)  . predniSONE (DELTASONE) 10 MG tablet 3 pills daily for 2 days, 2 pills daily for 2 days, 1 pill daily for 2 days (Patient not taking: Reported on 07/17/2017)   No facility-administered encounter medications on file as of 07/17/2017.      Review of Systems  Constitutional:   No  weight loss, night sweats,  fevers, chills, fatigue, or  lassitude HEENT:   No headaches,  Difficulty swallowing,   Tooth/dental problems, or  Sore throat, No sneezing, itching, ear ache, nasal congestion, post nasal drip  CV: No chest pain,  orthopnea, PND, swelling in lower extremities, anasarca, dizziness, palpitations, syncope  GI: No heartburn, indigestion, abdominal pain, nausea, vomiting, diarrhea, change in bowel habits, loss of appetite, bloody stools Resp: +occasional dry cough No shortness of breath with exertion or at rest.  No excess mucus, no productive cough, No coughing up of blood.  No change in color of mucus.  No wheezing.  No chest wall deformity Skin: no rash, lesions, no skin changes. GU: no dysuria, change in color of urine, no urgency or frequency.  No flank pain, no hematuria  MS: +right side pain occasionally  No joint pain or swelling.  No decreased range of motion.  No back pain. Psych:  No change in mood or affect. No depression or anxiety.  No memory loss.   Physical Exam  BP 130/80   Pulse 85   Ht 5\' 11"  (1.803 m)   Wt 232 lb 9.6 oz (105.5 kg)   SpO2 98%   BMI 32.44 kg/m   Wt Readings from Last 3 Encounters:  07/17/17 232 lb 9.6 oz (105.5 kg)  07/03/17 237 lb (107.5 kg)  01/31/16 247 lb (112 kg)    GEN: A/Ox3; pleasant , NAD, well nourished, on RA    HEENT:  [place]/AT,  EACs-clear, TMs-wnl, NOSE-clear, THROAT-clear, no lesions, no postnasal drip or exudate noted.   NECK:  Supple w/ fair ROM; no JVD;  no lymphadenopathy.    RESP:  Clear  P & A; w/o, wheezes/ rales/ or rhonchi. no accessory muscle use, no dullness to percussion  CARD:  RRR, no m/r/g, no peripheral edema, pulses intact, no cyanosis or clubbing.  GI:   Soft & nt; nml bowel sounds; no organomegaly or masses detected.   Musco: Warm bil, no deformities or joint swelling noted.   Neuro: alert, no focal deficits noted.    Skin: Warm, no lesions or rashes  Epworths today: 6  Lab Results:  CBC    Component Value Date/Time   WBC 4.4 07/02/2017 2055   RBC 4.83 07/02/2017 2055   HGB 14.3 07/02/2017  2055   HCT 42.7 07/02/2017 2055   PLT 203 07/02/2017 2055   MCV 88.4 07/02/2017 2055   MCH 29.6 07/02/2017 2055   MCHC 33.5 07/02/2017 2055   RDW 14.0 07/02/2017 2055    BMET    Component Value Date/Time   NA 138 07/02/2017 2055   K 3.7 07/02/2017 2055   CL 103 07/02/2017 2055   CO2 24 07/02/2017 2055   GLUCOSE 114 (H) 07/02/2017 2055   BUN 12 07/02/2017 2055   CREATININE 1.08 07/02/2017 2055   CALCIUM 8.6 (L) 07/02/2017 2055   GFRNONAA >60 07/02/2017 2055   GFRAA >60 07/02/2017 2055    BNP No results found for: BNP  ProBNP No results found for: PROBNP  Imaging: Dg Chest 2 View  Result Date: 07/17/2017 CLINICAL DATA:  52 y/o  M;  dyspnea and pneumonia 2 weeks ago. EXAM: CHEST - 2 VIEW COMPARISON:  07/02/2017 chest radiograph FINDINGS: Stable heart size and mediastinal contours are within normal limits. Both lungs are clear. The visualized skeletal structures are unremarkable. IMPRESSION: No acute pulmonary process identified.  No residual consolidation. Electronically Signed   By: Mitzi Hansen M.D.   On: 07/17/2017 14:05   Dg Chest 2 View  Result Date: 07/02/2017 CLINICAL DATA:  Shortness of breath for 2 days. EXAM: CHEST - 2 VIEW COMPARISON:  None. FINDINGS: The heart size and mediastinal contours are within normal limits. The lung volumes are low. There is patchy consolidation of medial right lung base. There is no pleural effusion or pulmonary edema. The visualized skeletal structures are unremarkable. IMPRESSION: Mild patchy consolidation of medial right lung base developing pneumonia is not excluded. Electronically Signed   By: Sherian Rein M.D.   On: 07/02/2017 20:52     Assessment & Plan:   Pleasant 52 year old patient seen in office today.  Patient reporting his breathing is doing much better since being treated for his right lower lobe pneumonia.  Patient has finished his prednisone as well as antibiotics.  Patient reporting only needing to use his  rescue inhaler right before he rides his bicycle.  This is about 3 times a week.  Epworth today is 6.  Patient denies any sort of concerns or issues with his sleep at this period of time.  We will hold off on home sleep study.  We will schedule patient for 6 to 8-week follow-up as well as pulmonary function test that same day.  To further evaluate this exercise-induced bronchospasm.  Cough Refilled Tessalon prescription today Follow-up with our office if cough worsens, or becomes productive, change in sputum color If you have any concerns about your respiratory status please follow-up with our office PFTs in the next 6 to 8 weeks Follow-up visit in 6 to 8 weeks  Exercise induced bronchospasm Continue rescue inhaler as needed We will do pulmonary function test the next 6 to 8 weeks followed by a follow-up visit with our office     Coral Ceo, NP 07/17/2017

## 2017-07-17 ENCOUNTER — Encounter: Payer: Self-pay | Admitting: Pulmonary Disease

## 2017-07-17 ENCOUNTER — Ambulatory Visit (INDEPENDENT_AMBULATORY_CARE_PROVIDER_SITE_OTHER)
Admission: RE | Admit: 2017-07-17 | Discharge: 2017-07-17 | Disposition: A | Discharge status: Home / Self Care | Payer: 59 | Source: Ambulatory Visit | Attending: Pulmonary Disease | Admitting: Pulmonary Disease

## 2017-07-17 ENCOUNTER — Ambulatory Visit (INDEPENDENT_AMBULATORY_CARE_PROVIDER_SITE_OTHER): Payer: 59 | Admitting: Pulmonary Disease

## 2017-07-17 ENCOUNTER — Other Ambulatory Visit: Payer: Self-pay | Admitting: Pulmonary Disease

## 2017-07-17 VITALS — BP 130/80 | HR 85 | Ht 71.0 in | Wt 232.6 lb

## 2017-07-17 DIAGNOSIS — R05 Cough: Secondary | ICD-10-CM

## 2017-07-17 DIAGNOSIS — R0602 Shortness of breath: Secondary | ICD-10-CM | POA: Diagnosis not present

## 2017-07-17 DIAGNOSIS — J45990 Exercise induced bronchospasm: Secondary | ICD-10-CM | POA: Insufficient documentation

## 2017-07-17 DIAGNOSIS — R059 Cough, unspecified: Secondary | ICD-10-CM | POA: Insufficient documentation

## 2017-07-17 MED ORDER — BENZONATATE 100 MG PO CAPS: 200.0000 mg | ORAL_CAPSULE | Freq: Three times a day (TID) | ORAL | 0 refills | Status: DC | PRN

## 2017-07-17 NOTE — Patient Instructions (Addendum)
Thank you for your follow-up today Chest x-ray today showing clearing of the right lower lobe pneumonia Refilled your Tessalon [place] prescription >>> Can use every 8 hours to help with cough as needed  We will have you follow-up in 6 to 8 weeks To complete pulmonary function test.  With a follow-up appointment afterwards    Please contact the office if your symptoms worsen or you have concerns that you are not improving.   Thank you for choosing [facility] for your healthcare, and for allowing us to partner with you on your healthcare journey. I am thankful to be able to provide care to you today.   Elisha HeadlandBrian Jennene Downie FNP-C

## 2017-07-17 NOTE — Progress Notes (Signed)
Reviewed and agree with assessment/plan.   Beaulah Romanek, MD [facility] 01/20/2016, 12:24 PM Pager:  [phone]  

## 2017-07-17 NOTE — Assessment & Plan Note (Signed)
Refilled Tessalon prescription today Follow-up with our office if cough worsens, or becomes productive, change in sputum color If you have any concerns about your respiratory status please follow-up with our office PFTs in the next 6 to 8 weeks Follow-up visit in 6 to 8 weeks

## 2017-07-17 NOTE — Assessment & Plan Note (Signed)
Continue rescue inhaler as needed We will do pulmonary function test the next 6 to 8 weeks followed by a follow-up visit with our office

## 2017-07-17 NOTE — Progress Notes (Signed)
Discussed results with patient in office.  No further follow-up needed at this time.  Elisha HeadlandBrian Diogenes Whirley FNP

## 2017-08-02 ENCOUNTER — Institutional Professional Consult (permissible substitution): Payer: 59 | Admitting: Pulmonary Disease

## 2017-08-30 ENCOUNTER — Ambulatory Visit: Payer: 59 | Admitting: Pulmonary Disease

## 2017-09-13 ENCOUNTER — Ambulatory Visit: Payer: 59 | Admitting: Pulmonary Disease

## 2019-04-09 ENCOUNTER — Observation Stay (HOSPITAL_BASED_OUTPATIENT_CLINIC_OR_DEPARTMENT_OTHER): Payer: 59

## 2019-04-09 ENCOUNTER — Observation Stay (HOSPITAL_COMMUNITY): Payer: 59

## 2019-04-09 ENCOUNTER — Emergency Department (HOSPITAL_COMMUNITY): Payer: 59

## 2019-04-09 ENCOUNTER — Observation Stay (HOSPITAL_COMMUNITY)
Admission: EM | Admit: 2019-04-09 | Discharge: 2019-04-10 | Disposition: A | Discharge status: Home / Self Care | Payer: 59 | Attending: Internal Medicine | Admitting: Internal Medicine

## 2019-04-09 ENCOUNTER — Other Ambulatory Visit: Payer: Self-pay

## 2019-04-09 ENCOUNTER — Encounter (HOSPITAL_COMMUNITY): Payer: Self-pay | Admitting: Emergency Medicine

## 2019-04-09 DIAGNOSIS — I10 Essential (primary) hypertension: Secondary | ICD-10-CM | POA: Diagnosis present

## 2019-04-09 DIAGNOSIS — N179 Acute kidney failure, unspecified: Secondary | ICD-10-CM | POA: Diagnosis present

## 2019-04-09 DIAGNOSIS — G9389 Other specified disorders of brain: Secondary | ICD-10-CM | POA: Insufficient documentation

## 2019-04-09 DIAGNOSIS — J45909 Unspecified asthma, uncomplicated: Secondary | ICD-10-CM | POA: Insufficient documentation

## 2019-04-09 DIAGNOSIS — E785 Hyperlipidemia, unspecified: Secondary | ICD-10-CM | POA: Insufficient documentation

## 2019-04-09 DIAGNOSIS — E1169 Type 2 diabetes mellitus with other specified complication: Secondary | ICD-10-CM | POA: Diagnosis not present

## 2019-04-09 DIAGNOSIS — Z20822 Contact with and (suspected) exposure to covid-19: Secondary | ICD-10-CM | POA: Diagnosis not present

## 2019-04-09 DIAGNOSIS — E119 Type 2 diabetes mellitus without complications: Secondary | ICD-10-CM | POA: Insufficient documentation

## 2019-04-09 DIAGNOSIS — Z79899 Other long term (current) drug therapy: Secondary | ICD-10-CM | POA: Insufficient documentation

## 2019-04-09 DIAGNOSIS — Z7984 Long term (current) use of oral hypoglycemic drugs: Secondary | ICD-10-CM | POA: Insufficient documentation

## 2019-04-09 DIAGNOSIS — R55 Syncope and collapse: Secondary | ICD-10-CM | POA: Insufficient documentation

## 2019-04-09 DIAGNOSIS — R404 Transient alteration of awareness: Principal | ICD-10-CM | POA: Diagnosis present

## 2019-04-09 DIAGNOSIS — J45990 Exercise induced bronchospasm: Secondary | ICD-10-CM | POA: Diagnosis present

## 2019-04-09 DIAGNOSIS — R791 Abnormal coagulation profile: Secondary | ICD-10-CM | POA: Diagnosis not present

## 2019-04-09 LAB — CBC WITH DIFFERENTIAL/PLATELET
Abs Immature Granulocytes: 0.03 10*3/uL (ref 0.00–0.07)
Basophils Absolute: 0 10*3/uL (ref 0.0–0.1)
Basophils Relative: 1 %
Eosinophils Absolute: 0.2 10*3/uL (ref 0.0–0.5)
Eosinophils Relative: 3 %
HCT: 44.2 % (ref 39.0–52.0)
Hemoglobin: 13.8 g/dL (ref 13.0–17.0)
Immature Granulocytes: 1 %
Lymphocytes Relative: 42 %
Lymphs Abs: 2.6 10*3/uL (ref 0.7–4.0)
MCH: 28.9 pg (ref 26.0–34.0)
MCHC: 31.2 g/dL (ref 30.0–36.0)
MCV: 92.7 fL (ref 80.0–100.0)
Monocytes Absolute: 0.6 10*3/uL (ref 0.1–1.0)
Monocytes Relative: 9 %
Neutro Abs: 2.7 10*3/uL (ref 1.7–7.7)
Neutrophils Relative %: 44 %
Platelets: 274 10*3/uL (ref 150–400)
RBC: 4.77 MIL/uL (ref 4.22–5.81)
RDW: 14.1 % (ref 11.5–15.5)
WBC: 6 10*3/uL (ref 4.0–10.5)
nRBC: 0 % (ref 0.0–0.2)

## 2019-04-09 LAB — COMPREHENSIVE METABOLIC PANEL
ALT: 23 U/L (ref 0–44)
AST: 28 U/L (ref 15–41)
Albumin: 3.8 g/dL (ref 3.5–5.0)
Alkaline Phosphatase: 69 U/L (ref 38–126)
Anion gap: 17 — ABNORMAL HIGH (ref 5–15)
BUN: 14 mg/dL (ref 6–20)
CO2: 22 mmol/L (ref 22–32)
Calcium: 9.3 mg/dL (ref 8.9–10.3)
Chloride: 102 mmol/L (ref 98–111)
Creatinine, Ser: 1.3 mg/dL — ABNORMAL HIGH (ref 0.61–1.24)
GFR calc Af Amer: >60 mL/min (ref >60)
GFR calc non Af Amer: >60 mL/min (ref >60)
Glucose, Bld: 143 mg/dL — ABNORMAL HIGH (ref 70–99)
Potassium: 4.2 mmol/L (ref 3.5–5.1)
Sodium: 141 mmol/L (ref 135–145)
Total Bilirubin: 0.8 mg/dL (ref 0.3–1.2)
Total Protein: 7 g/dL (ref 6.5–8.1)

## 2019-04-09 LAB — GLUCOSE, CAPILLARY
Glucose-Capillary: 93 mg/dL (ref 70–99)
Glucose-Capillary: 129 mg/dL — ABNORMAL HIGH (ref 70–99)

## 2019-04-09 LAB — LIPID PANEL
Cholesterol: 114 mg/dL (ref 0–200)
HDL: 47 mg/dL (ref >40)
LDL Cholesterol: 54 mg/dL (ref 0–99)
Total CHOL/HDL Ratio: 2.4 RATIO
Triglycerides: 67 mg/dL (ref <150)
VLDL: 13 mg/dL (ref 0–40)

## 2019-04-09 LAB — D-DIMER, QUANTITATIVE: D-Dimer, Quant: 1.53 ug/mL-FEU — ABNORMAL HIGH (ref 0.00–0.50)

## 2019-04-09 LAB — TROPONIN I (HIGH SENSITIVITY)
Troponin I (High Sensitivity): 5 ng/L (ref <18)
Troponin I (High Sensitivity): 10 ng/L (ref <18)

## 2019-04-09 LAB — URINALYSIS, ROUTINE W REFLEX MICROSCOPIC
Bilirubin Urine: NEGATIVE
Glucose, UA: NEGATIVE mg/dL
Hgb urine dipstick: NEGATIVE
Ketones, ur: NEGATIVE mg/dL
Leukocytes,Ua: NEGATIVE
Nitrite: NEGATIVE
Protein, ur: NEGATIVE mg/dL
Specific Gravity, Urine: 1.032 — ABNORMAL HIGH (ref 1.005–1.030)
pH: 8 (ref 5.0–8.0)

## 2019-04-09 LAB — RAPID URINE DRUG SCREEN, HOSP PERFORMED
Amphetamines: NOT DETECTED
Barbiturates: NOT DETECTED
Benzodiazepines: NOT DETECTED
Cocaine: NOT DETECTED
Opiates: NOT DETECTED
Tetrahydrocannabinol: NOT DETECTED

## 2019-04-09 LAB — APTT: aPTT: 23 seconds — ABNORMAL LOW (ref 24–36)

## 2019-04-09 LAB — HEMOGLOBIN A1C
Hgb A1c MFr Bld: 6.2 % — ABNORMAL HIGH (ref 4.8–5.6)
Mean Plasma Glucose: 131.24 mg/dL

## 2019-04-09 LAB — MAGNESIUM: Magnesium: 1.8 mg/dL (ref 1.7–2.4)

## 2019-04-09 LAB — ECHOCARDIOGRAM COMPLETE
Height: 71 in
Weight: 3840 oz

## 2019-04-09 LAB — CBG MONITORING, ED
Glucose-Capillary: 133 mg/dL — ABNORMAL HIGH (ref 70–99)
Glucose-Capillary: 86 mg/dL (ref 70–99)
Glucose-Capillary: 78 mg/dL (ref 70–99)

## 2019-04-09 LAB — RESPIRATORY PANEL BY RT PCR (FLU A&B, COVID)
Influenza A by PCR: NEGATIVE
Influenza B by PCR: NEGATIVE
SARS Coronavirus 2 by RT PCR: NEGATIVE

## 2019-04-09 LAB — TSH: TSH: 0.971 u[IU]/mL (ref 0.350–4.500)

## 2019-04-09 LAB — HIV ANTIBODY (ROUTINE TESTING W REFLEX): HIV Screen 4th Generation wRfx: NONREACTIVE

## 2019-04-09 LAB — LACTIC ACID, PLASMA
Lactic Acid, Venous: 2.5 mmol/L (ref 0.5–1.9)
Lactic Acid, Venous: 3.1 mmol/L (ref 0.5–1.9)

## 2019-04-09 LAB — PROTIME-INR
INR: 1 (ref 0.8–1.2)
Prothrombin Time: 12.8 seconds (ref 11.4–15.2)

## 2019-04-09 MED ORDER — LORAZEPAM 2 MG/ML IJ SOLN: 2.0000 mg | INTRAMUSCULAR | Status: DC | PRN

## 2019-04-09 MED ORDER — GADOBUTROL 1 MMOL/ML IV SOLN
10.0000 mL | Freq: Once | INTRAVENOUS | Status: AC | PRN
  Administered 2019-04-09: 10 mL via INTRAVENOUS

## 2019-04-09 MED ORDER — SODIUM CHLORIDE 0.9 % IV SOLN: INTRAVENOUS | Status: DC

## 2019-04-09 MED ORDER — ONDANSETRON HCL 4 MG PO TABS: 4.0000 mg | ORAL_TABLET | Freq: Four times a day (QID) | ORAL | Status: DC | PRN

## 2019-04-09 MED ORDER — ATORVASTATIN CALCIUM 10 MG PO TABS
20.0000 mg | ORAL_TABLET | Freq: Every day | ORAL | Status: DC
  Administered 2019-04-09: 20 mg via ORAL
  Filled 2019-04-09: qty 2

## 2019-04-09 MED ORDER — ALBUTEROL SULFATE (2.5 MG/3ML) 0.083% IN NEBU: 2.5000 mg | INHALATION_SOLUTION | Freq: Four times a day (QID) | RESPIRATORY_TRACT | Status: DC | PRN

## 2019-04-09 MED ORDER — ENOXAPARIN SODIUM 40 MG/0.4ML ~~LOC~~ SOLN: 40.0000 mg | Freq: Every day | SUBCUTANEOUS | Status: DC

## 2019-04-09 MED ORDER — LORAZEPAM 2 MG/ML IJ SOLN: 1.0000 mg | INTRAMUSCULAR | Status: DC | PRN

## 2019-04-09 MED ORDER — ACETAMINOPHEN 325 MG PO TABS: 650.0000 mg | ORAL_TABLET | Freq: Four times a day (QID) | ORAL | Status: DC | PRN

## 2019-04-09 MED ORDER — IOHEXOL 350 MG/ML SOLN
75.0000 mL | Freq: Once | INTRAVENOUS | Status: AC | PRN
  Administered 2019-04-09: 75 mL via INTRAVENOUS

## 2019-04-09 MED ORDER — SODIUM CHLORIDE 0.9% FLUSH
3.0000 mL | Freq: Two times a day (BID) | INTRAVENOUS | Status: DC
  Administered 2019-04-09 – 2019-04-10 (×3): 3 mL via INTRAVENOUS

## 2019-04-09 MED ORDER — INSULIN ASPART 100 UNIT/ML ~~LOC~~ SOLN: 0.0000 [IU] | Freq: Three times a day (TID) | SUBCUTANEOUS | Status: DC

## 2019-04-09 MED ORDER — LEVETIRACETAM 500 MG PO TABS
500.0000 mg | ORAL_TABLET | Freq: Two times a day (BID) | ORAL | Status: DC
  Administered 2019-04-09 – 2019-04-10 (×2): 500 mg via ORAL
  Filled 2019-04-09 (×2): qty 1

## 2019-04-09 MED ORDER — ACETAMINOPHEN 650 MG RE SUPP: 650.0000 mg | Freq: Four times a day (QID) | RECTAL | Status: DC | PRN

## 2019-04-09 MED ORDER — ONDANSETRON HCL 4 MG/2ML IJ SOLN: 4.0000 mg | Freq: Four times a day (QID) | INTRAMUSCULAR | Status: DC | PRN

## 2019-04-09 MED ORDER — ENALAPRIL MALEATE 5 MG PO TABS
5.0000 mg | ORAL_TABLET | Freq: Every day | ORAL | Status: DC
  Administered 2019-04-09 – 2019-04-10 (×2): 5 mg via ORAL
  Filled 2019-04-09 (×2): qty 1

## 2019-04-09 MED ORDER — SODIUM CHLORIDE 0.9 % IV BOLUS
1000.0000 mL | Freq: Once | INTRAVENOUS | Status: AC
  Administered 2019-04-09: 1000 mL via INTRAVENOUS

## 2019-04-09 NOTE — Progress Notes (Addendum)
CT scan of the brain obtained for concern for the possibility of mass causing seizure did reveal signs of edema in the right temporal lobe.  MRI of the brain with and without contrast ordered for further characterization. Neurology was formally consulted.  Ativan IV prnadded as needed for seizure activity.  Will follow-up further recommendations by neurology.

## 2019-04-09 NOTE — Progress Notes (Signed)
Dr. Maisie Fus of neurosurgery was formally consulted after MRI of the brain with and without contrast resulted showing signs of possible meningioma with mass-effect.

## 2019-04-09 NOTE — Consult Note (Signed)
Subjective: Patient is a 54 y.o. right handed man suffered a complex partial seizure and was taken to the hospital.  He was found to have a dural based extra-axial mass, likely a meningioma, along the right tentorium with small amount of vasogenic edema in his parahippocampal gyrus.  This was his first-time siezure.  He is a [employer].  Patient Active Problem List   Diagnosis Date Noted  . Syncope 04/09/2019  . Altered awareness, transient 04/09/2019  . AKI (acute kidney injury) (HCC) 04/09/2019  . Essential hypertension 04/09/2019  . Type 2 diabetes mellitus with hyperlipidemia (HCC) 04/09/2019  . Cough 07/17/2017  . Exercise induced bronchospasm 07/17/2017   Past Medical History:  Diagnosis Date  . Asthma   . Diabetes mellitus without complication (HCC)   . Hyperlipidemia     History reviewed. No pertinent surgical history.  Medications Prior to Admission  Medication Sig Dispense Refill Last Dose  . albuterol (PROVENTIL HFA;VENTOLIN HFA) 108 (90 BASE) MCG/ACT inhaler Inhale 1-2 puffs into the lungs every 6 (six) hours as needed for wheezing or shortness of breath. 1 Inhaler 0   . atorvastatin (LIPITOR) 10 MG tablet Take 20 mg by mouth daily.   04/08/2019 at Unknown time  . enalapril (VASOTEC) 5 MG tablet Take 5 mg by mouth daily.   04/08/2019 at Unknown time  . metFORMIN (GLUCOPHAGE) 500 MG tablet Take 500 mg by mouth 2 (two) times daily with a meal.    04/08/2019 at Unknown time  . Multiple Vitamins-Minerals (MULTIVITAMIN WITH MINERALS) tablet Take 1 tablet by mouth daily.   04/08/2019 at Unknown time  . ibuprofen (ADVIL,MOTRIN) 200 MG tablet Take 200 mg by mouth every 6 (six) hours as needed for headache.      No Known Allergies  Social History   Tobacco Use  . Smoking status: Never Smoker  . Smokeless tobacco: Never Used  Substance Use Topics  . Alcohol use: No    Family History  Problem Relation Age of Onset  . Diabetes Mother   . Diabetes Father     Review of  Systems Pertinent items noted in HPI and remainder of comprehensive ROS otherwise negative.  Objective: Vital signs in last 24 hours: Temp:  [97.9 F (36.6 C)] 97.9 F (36.6 C) (03/16 0331) Pulse Rate:  [42-65] 57 (03/16 1445) Resp:  [12-22] 22 (03/16 1445) BP: (96-171)/(57-98) 118/72 (03/16 1445) SpO2:  [94 %-100 %] 98 % (03/16 1445) Weight:  [108.9 kg] 108.9 kg (03/16 0332)  Awake, alert, Ox3 Slightly obese, NAD [place]/AT Neck supple Breathing comfortably abd soft Ext wwp PERRL, VFC.  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  Given the adjacent edema in the parahippocampal gyrus as well as his young age, I discussed with the patient and his wife the possibility of resective surgery.  This would provide benefit of diagnostic certainty  and possible cure.  I also discussed the alternative of continued surveillance for growth, but given his relatively young age, it is likely this will ultimately require surgery anyway.  All questions and concerns were answered.  He and his family will decide if they would like to proceed with surgery this Thursday or would rather f/u in clinic.  All questions and concerns were answered.

## 2019-04-09 NOTE — ED Notes (Signed)
Pt back from CT

## 2019-04-09 NOTE — Procedures (Signed)
Patient Name: KMARION RAWL  MRN: [id number]  Epilepsy Attending: Charlsie Quest  Referring Physician/Provider: Dr. Lindie Spruce Date: 04/09/2019 Duration: 25.03 minutes  Patient history: 54 year old male with an episode of transient alteration of awareness.  MRI brain showed right temporal region extra-axial mass concerning for meningioma with localized mass-effect on posterior medial right temporal lobe.  EEG to evaluate for seizures.  Level of alertness: Awake, asleep  AEDs during EEG study: None  Technical aspects: This EEG study was done with scalp electrodes positioned according to the 10-20 International system of electrode placement. Electrical activity was acquired at a sampling rate of 500Hz  and reviewed with a high frequency filter of 70Hz  and a low frequency filter of 1Hz . EEG data were recorded continuously and digitally stored.   Description: The posterior dominant rhythm consists of 10 Hz activity of moderate voltage (25-35 uV) seen predominantly in posterior head regions, symmetric and reactive to eye opening and eye closing.  Sleep was characterized by vertex waves, maximal frontocentral. Hyperventilation and photic stimulation were not performed.  IMPRESSION: This study is within normal limits. No seizures or epileptiform discharges were seen throughout the recording.  Cicero Noy 

## 2019-04-09 NOTE — Progress Notes (Signed)
EEG complete - results pending 

## 2019-04-09 NOTE — ED Provider Notes (Signed)
[facility] EMERGENCY DEPARTMENT Provider Note   CSN: [id number] Arrival date & time: 04/09/19  8250     History Chief Complaint  Patient presents with  . Code STEMI    Jeremy Osborn is a 54 y.o. male.  HPI     This is a 54 year old male with a history of diabetes and hyperlipidemia who presents from home after being found down.  Per EMS report, patient was found on the ground by his wife.  He was pale and diaphoretic.  Upon EMS arrival he was awake, alert, and oriented.  He did appear pale and diaphoretic.  Did not have any complaints of chest pain or shortness of breath.  However, he was notably hypertensive and his EKG has some anterior ST elevations.  For this reason, he was brought in as a code STEMI.  On my evaluation, he has no complaints at this time.  He does not remember the event.  He does not remember feeling dizzy.  No history of syncope or seizure.  He is currently without chest pain or shortness of breath.  EMS reports that they gave nitroglycerin and his blood pressure decreased from 112/110->180/100.  Patient denies any recent illnesses or fevers.  Patient denies any alcohol or drug use.  Denies any early family history of heart disease.  Denies any tobacco use.  Past Medical History:  Diagnosis Date  . Asthma   . Diabetes mellitus without complication ([place])   . Hyperlipidemia     Patient Active Problem List   Diagnosis Date Noted  . Syncope 04/09/2019  . Cough 07/17/2017  . Exercise induced bronchospasm 07/17/2017    History reviewed. No pertinent surgical history.     Family History  Problem Relation Age of Onset  . Diabetes Mother   . Diabetes Father     Social History   Tobacco Use  . Smoking status: Never Smoker  . Smokeless tobacco: Never Used  Substance Use Topics  . Alcohol use: No  . Drug use: No    Home Medications Prior to Admission medications   Medication Sig Start Date End Date Taking? Authorizing Provider    albuterol (PROVENTIL HFA;VENTOLIN HFA) 108 (90 BASE) MCG/ACT inhaler Inhale 1-2 puffs into the lungs every 6 (six) hours as needed for wheezing or shortness of breath. 06/12/13  Yes Mabe, Shanon Brow, NP  atorvastatin (LIPITOR) 10 MG tablet Take 20 mg by mouth daily.   Yes [provider]  enalapril (VASOTEC) 5 MG tablet Take 5 mg by mouth daily. 03/09/19  Yes [provider]  metFORMIN (GLUCOPHAGE) 500 MG tablet Take 500 mg by mouth 2 (two) times daily with a meal.    Yes [provider]  Multiple Vitamins-Minerals (MULTIVITAMIN WITH MINERALS) tablet Take 1 tablet by mouth daily.   Yes [provider]  ibuprofen (ADVIL,MOTRIN) 200 MG tablet Take 200 mg by mouth every 6 (six) hours as needed for headache.    [provider]    Allergies    Patient has no known allergies.  Review of Systems   Review of Systems  Constitutional: Positive for diaphoresis. Negative for fever.  Respiratory: Negative for shortness of breath.   Cardiovascular: Negative for chest pain.  Gastrointestinal: Negative for abdominal pain, diarrhea, nausea and vomiting.  Genitourinary: Negative for dysuria.  Neurological: Positive for syncope. Negative for dizziness, speech difficulty, weakness, light-headedness and headaches.  All other systems reviewed and are negative.   Physical Exam Updated Vital Signs BP 129/64  Pulse (!) 42   Temp 97.9 F (36.6 C) (Oral)   Resp 18   Ht 1.803 m (5\' 11" )   Wt 108.9 kg   SpO2 100%   BMI 33.47 kg/m   Physical Exam Vitals and nursing note reviewed.  Constitutional:      Appearance: He is well-developed. He is diaphoretic. He is not ill-appearing.  HENT:     Head: Normocephalic and atraumatic.     Mouth/Throat:     Mouth: Mucous membranes are moist.  Eyes:     Pupils: Pupils are equal, round, and reactive to light.  Cardiovascular:     Rate and Rhythm: Normal rate and regular rhythm.     Heart sounds: Normal heart sounds. No  murmur.  Pulmonary:     Effort: Pulmonary effort is normal. No respiratory distress.     Breath sounds: Normal breath sounds. No wheezing.  Abdominal:     General: Bowel sounds are normal.     Palpations: Abdomen is soft.     Tenderness: There is no abdominal tenderness. There is no rebound.  Musculoskeletal:     Cervical back: Neck supple.     Right lower leg: No edema.     Left lower leg: No edema.  Skin:    General: Skin is warm.  Neurological:     Mental Status: He is alert and oriented to person, place, and time.     Comments: Cranial nerves II through XII intact, 5 out of 5 strength in all 4 extremities, no dysmetria to finger-nose-finger  Psychiatric:        Mood and Affect: Mood normal.     ED Results / Procedures / Treatments   Labs (all labs ordered are listed, but only abnormal results are displayed) Labs Reviewed  HEMOGLOBIN A1C - Abnormal; Notable for the following components:      Result Value   Hgb A1c MFr Bld 6.2 (*)    All other components within normal limits  APTT - Abnormal; Notable for the following components:   aPTT 23 (*)    All other components within normal limits  COMPREHENSIVE METABOLIC PANEL - Abnormal; Notable for the following components:   Glucose, Bld 143 (*)    Creatinine, Ser 1.30 (*)    Anion gap 17 (*)    All other components within normal limits  D-DIMER, QUANTITATIVE (NOT AT [facility]) - Abnormal; Notable for the following components:   D-Dimer, Quant 1.53 (*)    All other components within normal limits  CBG MONITORING, ED - Abnormal; Notable for the following components:   Glucose-Capillary 133 (*)    All other components within normal limits  RESPIRATORY PANEL BY RT PCR (FLU A&B, COVID)  CBC WITH DIFFERENTIAL/PLATELET  PROTIME-INR  LIPID PANEL  RAPID URINE DRUG SCREEN, HOSP PERFORMED  TSH  HIV ANTIBODY (ROUTINE TESTING W REFLEX)  MAGNESIUM  URINALYSIS, ROUTINE W REFLEX MICROSCOPIC  CBG MONITORING, ED  TROPONIN I (HIGH  SENSITIVITY)  TROPONIN I (HIGH SENSITIVITY)    EKG EKG Interpretation  Date/Time:  Tuesday April 09 2019 03:32:44 EDT Ventricular Rate:  57 PR Interval:    QRS Duration: 93 QT Interval:  437 QTC Calculation: 426 R Axis:   23 Text Interpretation: Normal sinus rhythm Anterior ST elevation similar to prior Confirmed by [id number] (Ross Marcus) on 04/09/2019 4:18:50 AM   Radiology CT Angio Chest PE W and/or Wo Contrast  Result Date: 04/09/2019 CLINICAL DATA:  Found unresponsive, pale, core and diaphoretic, foaming at the mouth. History  of diabetes and asthma EXAM: CT ANGIOGRAPHY CHEST WITH CONTRAST TECHNIQUE: Multidetector CT imaging of the chest was performed using the standard protocol during bolus administration of intravenous contrast. Multiplanar CT image reconstructions and MIPs were obtained to evaluate the vascular anatomy. CONTRAST:  61mL OMNIPAQUE IOHEXOL 350 MG/ML SOLN COMPARISON:  Chest radiograph 04/09/2019 FINDINGS: Cardiovascular: Satisfactory opacification the pulmonary arteries to the segmental level. No pulmonary artery filling defects are identified. Central pulmonary arteries are normal caliber. The aorta is normal caliber. Normal 3 vessel branching of the aortic arch. Mild cardiomegaly with predominantly right heart enlargement. No pericardial effusion. Mediastinum/Nodes: No mediastinal fluid or gas. Uniform appearance of the thyroid gland and thoracic inlet. No acute abnormality of the trachea or esophagus. No worrisome mediastinal, hilar or axillary adenopathy. Lungs/Pleura: No consolidation, features of edema, pneumothorax, or effusion. No suspicious pulmonary nodules or masses. Upper Abdomen: No acute abnormalities present in the visualized portions of the upper abdomen. Musculoskeletal: No acute osseous abnormality or suspicious osseous lesion. Review of the MIP images confirms the above findings. IMPRESSION: 1. No acute pulmonary artery filling defects identified to suggest  pulmonary embolism. 2. No acute abnormality identified in the chest. 3. Mild cardiomegaly with predominantly right heart enlargement. Electronically Signed   By: Kreg Shropshire M.D.   On: 04/09/2019 05:51   DG Chest Port 1 View  Result Date: 04/09/2019 CLINICAL DATA:  53 year old male with syncope. EXAM: PORTABLE CHEST 1 VIEW COMPARISON:  Chest radiograph dated 07/17/2017. FINDINGS: There is no focal consolidation, pleural effusion or pneumothorax. The cardiac silhouette is within normal limits. No acute osseous pathology. IMPRESSION: No active disease. Electronically Signed   By: Elgie Collard M.D.   On: 04/09/2019 04:04    Procedures Procedures (including critical care time)  Medications Ordered in ED Medications  atorvastatin (LIPITOR) tablet 20 mg (has no administration in time range)  enalapril (VASOTEC) tablet 5 mg (has no administration in time range)  enoxaparin (LOVENOX) injection 40 mg (has no administration in time range)  sodium chloride flush (NS) 0.9 % injection 3 mL (has no administration in time range)  acetaminophen (TYLENOL) tablet 650 mg (has no administration in time range)    Or  acetaminophen (TYLENOL) suppository 650 mg (has no administration in time range)  ondansetron (ZOFRAN) tablet 4 mg (has no administration in time range)    Or  ondansetron (ZOFRAN) injection 4 mg (has no administration in time range)  albuterol (PROVENTIL) (2.5 MG/3ML) 0.083% nebulizer solution 2.5 mg (has no administration in time range)  insulin aspart (novoLOG) injection 0-9 Units (has no administration in time range)  0.9 %  sodium chloride infusion (has no administration in time range)  iohexol (OMNIPAQUE) 350 MG/ML injection 75 mL (75 mLs Intravenous Contrast Given 04/09/19 0534)    ED Course  I have reviewed the triage vital signs and the nursing notes.  Pertinent labs & imaging results that were available during my care of the patient were reviewed by me and considered in my  medical decision making (see chart for details).    MDM Rules/Calculators/A&P                       Patient presents after being found down.  Initially was a code STEMI but this was canceled as his EKG is unchanged from prior.  He is currently asymptomatic.  He is amnestic to the event.  He did not have a prodrome.  This is highly concerning for possible cardiac syncope versus seizure.  Initial troponin negative.  Screening D-dimer was positive.  CT scan does not show any evidence of pulmonary embolism although he does have a slightly enlarged heart.  Basic lab work without significant metabolic derangement.  Patient noted on the monitor to have a baseline heart rate in the 40s.  He reports that he does exercise significantly.  Unclear whether this is his baseline.  Feel he warrants admission for observation and possible echocardiogram given enlarged heart and syncope without a prodrome.  Gust with Dr. Toniann Fail.  Final Clinical Impression(s) / ED Diagnoses Final diagnoses:  Syncope, unspecified syncope type    Rx / DC Orders ED Discharge Orders    None       Letrice Pollok, Mayer Masker, MD 04/09/19 [phone]

## 2019-04-09 NOTE — Progress Notes (Signed)
  Echocardiogram 2D Echocardiogram has been performed.  Leta Jungling M 04/09/2019, 8:57 AM

## 2019-04-09 NOTE — Consult Note (Addendum)
Neurology Consultation Reason for Consult: Transient alteration of awareness, abnormal CT head Referring Physician: Dr. Madelyn Flavors  CC: Transient alteration of awareness  History is obtained from: Patient, wife, chart review  HPI: Jeremy Osborn is a 54 y.o. right-handed male with past medical history of hypertension, hyperlipidemia, type 2 diabetes and exercise-induced asthma who presented after an episode of transient alteration of awareness.  Husband states he does not remember the episode.  Wife at bedside states patient went to bed as usual last night.  At around 2:30 AM she woke up because patient was having jerking type of movements in bilateral upper extremities.  She tried to wake him up but he was difficult to arouse.  She also noticed some foaming at his mouth and heavy breathing.  Eventually patient woke up and tried to get out of bed, hitting his head into the wall in the process.  She made him sit down on the bed and called EMS.  By the time EMS arrived, patient was starting to return to baseline.  Per wife, EMS also noted that patient was drenched in sweat.  Patient denies similar episodes in the past, history of seizures, recent headache, changes in vision, auditory changes, tingling, numbness, weakness, fever, neck stiffness, recent traveling, exposure to ticks,/new animals/pets, recent infection, cough, cold, diarrhea.  He does report receiving COVID-19 vaccination about 10 days ago and colonoscopy about 2 days ago.  Patient's brother had seizures that he grew out of around the age of 12-13 years.  He does not know any further details about the seizures.  Seizure risk factors: Denies prior history of head injury, febrile seizures, meningitis/encephalitis.   ROS: A 14 point ROS was performed and is negative except as noted in the HPI.  Past Medical History:  Diagnosis Date  . Asthma   . Diabetes mellitus without complication ([facility])   . Hyperlipidemia     Family History    Problem Relation Age of Onset  . Diabetes Mother   . Diabetes Father    Social History:  reports that he has never smoked. He has never used smokeless tobacco. He reports that he does not drink alcohol or use drugs.   Exam: Current vital signs: BP 115/64   Pulse (!) 42   Temp 97.9 F (36.6 C) (Oral)   Resp 18   Ht 5\' 11"  (1.803 m)   Wt 108.9 kg   SpO2 100%   BMI 33.47 kg/m  Vital signs in last 24 hours: Temp:  [97.9 F (36.6 C)] 97.9 F (36.6 C) (03/16 0331) Pulse Rate:  [42-65] 42 (03/16 1020) Resp:  [12-21] 18 (03/16 1020) BP: (96-171)/(57-98) 115/64 (03/16 1020) SpO2:  [94 %-100 %] 100 % (03/16 1020) Weight:  [108.9 kg] 108.9 kg (03/16 0332)   Physical Exam  Constitutional: Appears well-developed and well-nourished.  Psych: Affect appropriate to situation Eyes: No scleral injection HENT: No OP obstrucion Head: Normocephalic.  Cardiovascular: Normal rate and regular rhythm.  Respiratory: Effort normal, non-labored breathing GI: Soft.  No distension. There is no tenderness.  Skin: WDI  Neuro: Mental Status: Patient is awake, alert, oriented to person, place, month, year, and situation. Patient is able to give a clear and coherent history. No signs of aphasia or neglect Cranial Nerves: II: Visual Fields are full. Pupils are equal, round, and reactive to light.   III,IV, VI: EOMI without ptosis or diploplia.  V: Facial sensation is symmetric to temperature VII: Facial movement is symmetric.  VIII: hearing is intact  to voice X: Uvula elevates symmetrically XI: Shoulder shrug is symmetric. XII: tongue is midline without atrophy or fasciculations.  Motor: Tone is normal. Bulk is normal. 5/5 strength was present in all four extremities.  Sensory: Sensation is symmetric to light touch  in the arms and legs. Deep Tendon Reflexes: 2+ and symmetric in the biceps and patellae.  Plantars: Toes are downgoing bilaterally.  Cerebellar: FNF are intact  bilaterally  I have reviewed labs in epic and the results pertinent to this consultation are: Serum creatinine 1.3, normal BUN Lactic acid 2.5  I have reviewed the images obtained:  CT head without contrast 04/09/2019: Small area of white matter hypoattenuation in the right temporal lobe without apparent loss of gray differentiation.     ASSESSMENT/PLAN:   Transient alteration of awareness Right temporal lesion (POA) Suspected seizure AKI Type 2 diabetes Lactic acidosis -Semiology of the episode along with CT head findings of attenuation in right temporal lobe concerning for seizure. -Patient is afebrile, denies neck stiffness, exam not consistent with bacterial meningitis.  Therefore will hold off antibiotics and antivirals at this point.  Recommendations -MRI brain with and without contrast ordered and pending to look for acute abnormality -Routine EEG to assess for any potential epileptogenicity -Depending on MRI brain results, patient might require lumbar puncture to look for any infectious/inflammatory etiologies.  Discussed with Dr. Tamala Julian to hold off  any anticoagulation/DVT prophylaxis -Depending on EEG results, will consider starting antiepileptics if needed. -Continue seizure precautions -As needed IV Ativan only for generalized tonic-clonic seizure lasting more than 2 minutes or focal seizure lasting more than 5 minutes -Neurologic diagnosis, management plan was discussed in detail with patient and wife at bedside.   ADDENDUM -Reviewed MRI brain, showed 2.1 x 2.3 x 1.5 mm centimeter right temporal extra-axial mass with local mass-effect most likely suggestive of meningioma -Routine EEG did not show any ictal-interictal abnormality. -However given the location of the meningioma and local mass-effect, the episode of transient alteration of awareness is concerning for seizure.  Therefore will start patient on Keppra 500 mg twice daily   Thank you for allowing [place] to  participate the care of this patient. Neurology will follow. Please page Neurohospitalist for any further questions after 5 PM.  Travia Onstad Barbra Sarks

## 2019-04-09 NOTE — Progress Notes (Signed)
This chaplain responded to Code Stemi in [facility].  Per MD, no needs at this time.  F/U spiritual care is available as needed.

## 2019-04-09 NOTE — H&P (Addendum)
History and Physical    Jeremy Osborn CVE:[id number] DOB: 25-Jun-1965 DOA: 04/09/2019  Referring MD/NP/PA: Midge Minium, MD PCP: Verdia Kuba, MD  Patient coming from: Home via EMS  Chief Complaint: Altered overnight  I have personally briefly reviewed patient's old medical records in [facility]   HPI: Jeremy Osborn is a 54 y.o. male with medical history significant of hypertension, hyperlipidemia, diabetes mellitus type 2, and exercise induced asthma who presents after an episode of altered awareness.  History is limited from the patient as he has no recollection of the events and his wife contributes additional history.  He had been in his normal state of health and went to bed around 11:30 PM last night.  His wife was woken up around 2 AM to him hitting her. She initially thought that he was just having a bad dream.  Reports that he was making gasping sounds for air.  She is able to get him to sit up and tried to stand him, but he fell into the wall hitting his head.  He tried getting up once again and fell to the ground.  Patient complains of some left knee pain and tenderness.  No loss of bowel or bladder reported.  She did not witness any tongue biting and she never saw him lose consciousness.  Patient was staring off as though he did not know what was going on.  He recently received his first Covid shot 10 days ago and had a colonoscopy 8 days ago.   He has had no previous history of heart issues and notes that his heart rates normally are in the 50s since he started riding a bicycle 6 years ago.  Denies having any recent nausea, vomiting, diarrhea, abdominal pain, fever, chills, chest pain, shortness of breath, or cough.  ED Course: Upon admission into the emergency department patient was seen as a code STEMI as initial EKG revealed ST wave elevation in the anterior leads, but this was later canceled after cardiology consulted.  Patient was afebrile, pulse 43-65, blood pressures  124/84-171/82 and all other vital signs maintained.  Labs significant for CO2 22, creatinine 1.3, glucose 143, anion gap 17, D-dimer 1.53, hemoglobin A1c 6.2, and high-sensitivity troponin negative.  Chest x-ray was clear and CT angiogram  of the chest was negative for a pulmonary embolus, but did note mild cardiomegaly with right heart enlargement.  Influenza and coronavirus screening were negative.  TRH called to admit.  Review of Systems  Constitutional: Negative for chills and fever.  HENT: Negative for sinus pain.   Eyes: Negative for photophobia and pain.  Respiratory: Negative for cough and shortness of breath.   Cardiovascular: Negative for chest pain and palpitations.  Gastrointestinal: Negative for abdominal pain, diarrhea, nausea and vomiting.  Genitourinary: Negative for dysuria and hematuria.  Musculoskeletal: Positive for falls and joint pain.  Skin: Negative for rash.  Neurological: Positive for loss of consciousness. Negative for speech change, focal weakness and headaches.  Psychiatric/Behavioral: Negative for memory loss and substance abuse.    Past Medical History:  Diagnosis Date   Asthma    Diabetes mellitus without complication (HCC)    Hyperlipidemia     History reviewed. No pertinent surgical history.   reports that he has never smoked. He has never used smokeless tobacco. He reports that he does not drink alcohol or use drugs.  No Known Allergies  Family History  Problem Relation Age of Onset   Diabetes Mother    Diabetes Father  Prior to Admission medications   Medication Sig Start Date End Date Taking? Authorizing Provider  albuterol (PROVENTIL HFA;VENTOLIN HFA) 108 (90 BASE) MCG/ACT inhaler Inhale 1-2 puffs into the lungs every 6 (six) hours as needed for wheezing or shortness of breath. 06/12/13  Yes Mabe, Onalee Hua, NP  atorvastatin (LIPITOR) 10 MG tablet Take 20 mg by mouth daily.   Yes [provider]  enalapril (VASOTEC) 5 MG tablet  Take 5 mg by mouth daily. 03/09/19  Yes [provider]  metFORMIN (GLUCOPHAGE) 500 MG tablet Take 500 mg by mouth 2 (two) times daily with a meal.    Yes [provider]  Multiple Vitamins-Minerals (MULTIVITAMIN WITH MINERALS) tablet Take 1 tablet by mouth daily.   Yes [provider]  ibuprofen (ADVIL,MOTRIN) 200 MG tablet Take 200 mg by mouth every 6 (six) hours as needed for headache.    [provider]    Physical Exam:  Constitutional: Middle-age male NAD, calm, comfortable Vitals:   04/09/19 0615 04/09/19 0630 04/09/19 0645 04/09/19 0700  BP: 136/68 (!) 128/57 126/85 129/64  Pulse: (!) 46 (!) 45 65 (!) 42  Resp: 15 15 13 18   Temp:      TempSrc:      SpO2: 100% 100% 100% 100%  Weight:      Height:       Eyes: PERRL, lids and conjunctivae normal ENMT: Mucous membranes are moist. Posterior pharynx clear of any exudate or lesions.Normal dentition.  Neck: normal, supple, no masses, no thyromegaly Respiratory: clear to auscultation bilaterally, no wheezing, no crackles. Normal respiratory effort. No accessory muscle use.  Cardiovascular: Bradycardia, no murmurs / rubs / gallops. No extremity edema. 2+ pedal pulses. No carotid bruits.  Abdomen: no tenderness, no masses palpated. No hepatosplenomegaly. Bowel sounds positive.  Musculoskeletal: no clubbing / cyanosis. No joint deformity upper and lower extremities. Good ROM, no contractures. Normal muscle tone.  Skin: no rashes, lesions, ulcers. No induration Neurologic: CN 2-12 grossly intact. Sensation intact, DTR normal. Strength 5/5 in all 4.  Psychiatric: Normal judgment and insight. Alert and oriented x 3. Normal mood.     Labs on Admission: I have personally reviewed following labs and imaging studies  CBC: Recent Labs  Lab 04/09/19 0335  WBC 6.0  NEUTROABS 2.7  HGB 13.8  HCT 44.2  MCV 92.7  PLT 274   Basic Metabolic Panel: Recent Labs  Lab 04/09/19 0335  NA 141  K 4.2  CL 102   CO2 22  GLUCOSE 143*  BUN 14  CREATININE 1.30*  CALCIUM 9.3   GFR: Estimated Creatinine Clearance: 84.3 mL/min (A) (by C-G formula based on SCr of 1.3 mg/dL (H)). Liver Function Tests: Recent Labs  Lab 04/09/19 0335  AST 28  ALT 23  ALKPHOS 69  BILITOT 0.8  PROT 7.0  ALBUMIN 3.8   No results for input(s): LIPASE, AMYLASE in the last 168 hours. No results for input(s): AMMONIA in the last 168 hours. Coagulation Profile: Recent Labs  Lab 04/09/19 0335  INR 1.0   Cardiac Enzymes: No results for input(s): CKTOTAL, CKMB, CKMBINDEX, TROPONINI in the last 168 hours. BNP (last 3 results) No results for input(s): PROBNP in the last 8760 hours. HbA1C: Recent Labs    04/09/19 0335  HGBA1C 6.2*   CBG: Recent Labs  Lab 04/09/19 0338  GLUCAP 133*   Lipid Profile: Recent Labs    04/09/19 0335  CHOL 114  HDL 47  LDLCALC 54  TRIG 67  CHOLHDL 2.4  Thyroid Function Tests: No results for input(s): TSH, T4TOTAL, FREET4, T3FREE, THYROIDAB in the last 72 hours. Anemia Panel: No results for input(s): VITAMINB12, FOLATE, FERRITIN, TIBC, IRON, RETICCTPCT in the last 72 hours. Urine analysis: No results found for: COLORURINE, APPEARANCEUR, LABSPEC, PHURINE, GLUCOSEU, HGBUR, BILIRUBINUR, KETONESUR, PROTEINUR, UROBILINOGEN, NITRITE, LEUKOCYTESUR Sepsis Labs: Recent Results (from the past 240 hour(s))  Respiratory Panel by RT PCR (Flu A&B, Covid) - Nasopharyngeal Swab     Status: None   Collection Time: 04/09/19  3:40 AM   Specimen: Nasopharyngeal Swab  Result Value Ref Range Status   SARS Coronavirus 2 by RT PCR NEGATIVE NEGATIVE Final    Comment: (NOTE) SARS-CoV-2 target nucleic acids are NOT DETECTED. The SARS-CoV-2 RNA is generally detectable in upper respiratoy specimens during the acute phase of infection. The lowest concentration of SARS-CoV-2 viral copies this assay can detect is 131 copies/mL. A negative result does not preclude SARS-Cov-2 infection and should  not be used as the sole basis for treatment or other patient management decisions. A negative result may occur with  improper specimen collection/handling, submission of specimen other than nasopharyngeal swab, presence of viral mutation(s) within the areas targeted by this assay, and inadequate number of viral copies (<131 copies/mL). A negative result must be combined with clinical observations, patient history, and epidemiological information. The expected result is Negative. Fact Sheet for Patients:  PinkCheek.be Fact Sheet for Healthcare Providers:  GravelBags.it This test is not yet ap proved or cleared by the [place] FDA and  has been authorized for detection and/or diagnosis of SARS-CoV-2 by FDA under an Emergency Use Authorization (EUA). This EUA will remain  in effect (meaning this test can be used) for the duration of the COVID-19 declaration under Section 564(b)(1) of the Act, 21 U.S.C. section 360bbb-3(b)(1), unless the authorization is terminated or revoked sooner.    Influenza A by PCR NEGATIVE NEGATIVE Final   Influenza B by PCR NEGATIVE NEGATIVE Final    Comment: (NOTE) The Xpert Xpress SARS-CoV-2/FLU/RSV assay is intended as an aid in  the diagnosis of influenza from Nasopharyngeal swab specimens and  should not be used as a sole basis for treatment. Nasal washings and  aspirates are unacceptable for Xpert Xpress SARS-CoV-2/FLU/RSV  testing. Fact Sheet for Patients: PinkCheek.be Fact Sheet for Healthcare Providers: GravelBags.it This test is not yet approved or cleared by the [place] FDA and  has been authorized for detection and/or diagnosis of SARS-CoV-2 by  FDA under an Emergency Use Authorization (EUA). This EUA will remain  in effect (meaning this test can be used) for the duration of the  Covid-19 declaration under Section 564(b)(1)  of the Act, 21  U.S.C. section 360bbb-3(b)(1), unless the authorization is  terminated or revoked. Performed at [facility], [place] [address] [place], [place] [address]      Radiological Exams on Admission: CT Angio Chest PE W and/or Wo Contrast  Result Date: 04/09/2019 CLINICAL DATA:  Found unresponsive, pale, core and diaphoretic, foaming at the mouth. History of diabetes and asthma EXAM: CT ANGIOGRAPHY CHEST WITH CONTRAST TECHNIQUE: Multidetector CT imaging of the chest was performed using the standard protocol during bolus administration of intravenous contrast. Multiplanar CT image reconstructions and MIPs were obtained to evaluate the vascular anatomy. CONTRAST:  15mL OMNIPAQUE IOHEXOL 350 MG/ML SOLN COMPARISON:  Chest radiograph 04/09/2019 FINDINGS: Cardiovascular: Satisfactory opacification the pulmonary arteries to the segmental level. No pulmonary artery filling defects are identified. Central pulmonary arteries are normal caliber. The aorta is normal  caliber. Normal 3 vessel branching of the aortic arch. Mild cardiomegaly with predominantly right heart enlargement. No pericardial effusion. Mediastinum/Nodes: No mediastinal fluid or gas. Uniform appearance of the thyroid gland and thoracic inlet. No acute abnormality of the trachea or esophagus. No worrisome mediastinal, hilar or axillary adenopathy. Lungs/Pleura: No consolidation, features of edema, pneumothorax, or effusion. No suspicious pulmonary nodules or masses. Upper Abdomen: No acute abnormalities present in the visualized portions of the upper abdomen. Musculoskeletal: No acute osseous abnormality or suspicious osseous lesion. Review of the MIP images confirms the above findings. IMPRESSION: 1. No acute pulmonary artery filling defects identified to suggest pulmonary embolism. 2. No acute abnormality identified in the chest. 3. Mild cardiomegaly with predominantly right heart enlargement. Electronically Signed   By: Kreg Shropshire  M.D.   On: 04/09/2019 05:51   DG Chest Port 1 View  Result Date: 04/09/2019 CLINICAL DATA:  54 year old male with syncope. EXAM: PORTABLE CHEST 1 VIEW COMPARISON:  Chest radiograph dated 07/17/2017. FINDINGS: There is no focal consolidation, pleural effusion or pneumothorax. The cardiac silhouette is within normal limits. No acute osseous pathology. IMPRESSION: No active disease. Electronically Signed   By: Elgie Collard M.D.   On: 04/09/2019 04:04    EKG: Independently reviewed.  Sinus bradycardia at 45 bpm  Assessment/Plan Transient altered awareness: Acute.  Patient presents after having an episode when she has no recollection of where he was reported to be gasping for air and right hand was shaking.  He reportedly fell twice in hit his head into the wall.  Initial concern was for possibility of a STEMI.  However, question possibility of a seizure given additional details provided by patient's wife. -Admit to a medical telemetry bed -Check lactic acid, TSH, and urine drug screen -Continue to trend cardiac troponin -Check echocardiogram -Follow-up telemetry -Appreciate cardiology consultative services, will follow-up for further recommendations. -Check CT scan of the brain -Add-on seizure precaution  Acute kidney injury: Patient presents with creatinine of 1.3 with BUN 14.  Baseline creatinine previously noted to be around 0.9 upon review of records from care everywhere in 07/2018. -Check urinalysis -Gentle IV fluids of saline at 75 mL/h -Recheck BMP in a.m.  Elevated anion gap: Patient presents with initial elevated anion gap of 17 CO2 at the lower limit of normal.  Unclear cause at this time. -Recheck BMP  Essential hypertension: Blood pressures initially noted to be elevated up to 171/82.  Home blood pressure medications include enalapril 5 mg daily.  Elevated blood pressure could have been secondary to acute stress. -Continue enalapril -Adjust blood pressure if  needed  Diabetes mellitus type 2: Patient appears well controlled with hemoglobin A1c noted to be 6.2 and initial glucose 143.  Home medications include Metformin 500 mg twice daily. -Hypoglycemic protocols -Hold Metformin -CBGs with meals sensitive SSI   History of asthma: Patient reports history of exercise-induced asthma. -Albuterol nebs as needed for shortness of breath/wheezing  Hyperlipidemia: Home medications include atorvastatin 20 mg daily. -Continue statin  Obesity: BMI 33.47 kg/m. -Continue to educate on need of weight loss  DVT prophylaxis: Lovenox Code Status: Full Family Communication: Discussed plan of care with patient's wife over the phone. Disposition Plan: Possible discharge home in 1 day depending on work-up of possible causes for presentation Consults called: Neurology Admission status: Observation  Clydie Braun MD [facility] Pager [phone]   If 7PM-7AM, please contact night-coverage [email or web address] Password [facility]  04/09/2019, 7:15 AM

## 2019-04-09 NOTE — Progress Notes (Signed)
Pt unavailable for his EEG. Pt will be going to MRI first. Will attempt later when schedule permits

## 2019-04-09 NOTE — ED Notes (Signed)
Informed provider of low heartrate, EKG taken and given to provider.

## 2019-04-09 NOTE — Plan of Care (Signed)

## 2019-04-09 NOTE — ED Triage Notes (Signed)
Per EMS, pt found unresponsive face down by wife, pale, cool, diaphoretic, foaming at mouth.  Denies SOB/CP.  Pt does not remember incident.  Alert and oriented X3 w/ EMS, A/O X4 in ED. Pt was given 324 ASA and 1 nitro (212/110 to 180/100)    HR 80 18RR 02 97% RA CBG 120  18 G L forarm

## 2019-04-10 DIAGNOSIS — R404 Transient alteration of awareness: Secondary | ICD-10-CM | POA: Diagnosis not present

## 2019-04-10 DIAGNOSIS — G40009 Localization-related (focal) (partial) idiopathic epilepsy and epileptic syndromes with seizures of localized onset, not intractable, without status epilepticus: Secondary | ICD-10-CM | POA: Diagnosis not present

## 2019-04-10 DIAGNOSIS — N179 Acute kidney failure, unspecified: Secondary | ICD-10-CM | POA: Diagnosis not present

## 2019-04-10 LAB — BASIC METABOLIC PANEL
Anion gap: 12 (ref 5–15)
BUN: 11 mg/dL (ref 6–20)
CO2: 24 mmol/L (ref 22–32)
Calcium: 8.7 mg/dL — ABNORMAL LOW (ref 8.9–10.3)
Chloride: 102 mmol/L (ref 98–111)
Creatinine, Ser: 1.01 mg/dL (ref 0.61–1.24)
GFR calc Af Amer: >60 mL/min (ref >60)
GFR calc non Af Amer: >60 mL/min (ref >60)
Glucose, Bld: 111 mg/dL — ABNORMAL HIGH (ref 70–99)
Potassium: 4 mmol/L (ref 3.5–5.1)
Sodium: 138 mmol/L (ref 135–145)

## 2019-04-10 LAB — CBC
HCT: 41.5 % (ref 39.0–52.0)
Hemoglobin: 13.4 g/dL (ref 13.0–17.0)
MCH: 28.9 pg (ref 26.0–34.0)
MCHC: 32.3 g/dL (ref 30.0–36.0)
MCV: 89.6 fL (ref 80.0–100.0)
Platelets: 241 10*3/uL (ref 150–400)
RBC: 4.63 MIL/uL (ref 4.22–5.81)
RDW: 14.1 % (ref 11.5–15.5)
WBC: 5.7 10*3/uL (ref 4.0–10.5)
nRBC: 0 % (ref 0.0–0.2)

## 2019-04-10 LAB — GLUCOSE, CAPILLARY
Glucose-Capillary: 111 mg/dL — ABNORMAL HIGH (ref 70–99)
Glucose-Capillary: 122 mg/dL — ABNORMAL HIGH (ref 70–99)

## 2019-04-10 MED ORDER — LEVETIRACETAM 500 MG PO TABS: 500.0000 mg | ORAL_TABLET | Freq: Two times a day (BID) | ORAL | 0 refills | Status: AC

## 2019-04-10 NOTE — Plan of Care (Signed)
  Problem: Education: Goal: Knowledge of General Education information will improve Description: Including pain rating scale, medication(s)/side effects and non-pharmacologic comfort measures 04/10/2019 1519 by Becky Augusta, RN Outcome: Adequate for Discharge 04/10/2019 1518 by Becky Augusta, RN Outcome: Adequate for Discharge   Problem: Health Behavior/Discharge Planning: Goal: Ability to manage health-related needs will improve 04/10/2019 1519 by Becky Augusta, RN Outcome: Adequate for Discharge 04/10/2019 1518 by Becky Augusta, RN Outcome: Adequate for Discharge   Problem: Clinical Measurements: Goal: Ability to maintain clinical measurements within normal limits will improve 04/10/2019 1519 by Becky Augusta, RN Outcome: Adequate for Discharge 04/10/2019 1518 by Becky Augusta, RN Outcome: Adequate for Discharge Goal: Will remain free from infection 04/10/2019 1519 by Becky Augusta, RN Outcome: Adequate for Discharge 04/10/2019 1518 by Becky Augusta, RN Outcome: Adequate for Discharge Goal: Diagnostic test results will improve 04/10/2019 1519 by Becky Augusta, RN Outcome: Adequate for Discharge 04/10/2019 1518 by Becky Augusta, RN Outcome: Adequate for Discharge Goal: Respiratory complications will improve 04/10/2019 1519 by Becky Augusta, RN Outcome: Adequate for Discharge 04/10/2019 1518 by Becky Augusta, RN Outcome: Adequate for Discharge Goal: Cardiovascular complication will be avoided 04/10/2019 1519 by Becky Augusta, RN Outcome: Adequate for Discharge 04/10/2019 1518 by Becky Augusta, RN Outcome: Adequate for Discharge   Problem: Activity: Goal: Risk for activity intolerance will decrease 04/10/2019 1519 by Becky Augusta, RN Outcome: Adequate for Discharge 04/10/2019 1518 by Becky Augusta, RN Outcome: Adequate for Discharge   Problem: Nutrition: Goal: Adequate nutrition will be maintained 04/10/2019 1519 by Becky Augusta, RN Outcome: Adequate for Discharge 04/10/2019 1518  by Becky Augusta, RN Outcome: Adequate for Discharge   Problem: Coping: Goal: Level of anxiety will decrease 04/10/2019 1519 by Becky Augusta, RN Outcome: Adequate for Discharge 04/10/2019 1518 by Becky Augusta, RN Outcome: Adequate for Discharge   Problem: Elimination: Goal: Will not experience complications related to bowel motility 04/10/2019 1519 by Becky Augusta, RN Outcome: Adequate for Discharge 04/10/2019 1518 by Becky Augusta, RN Outcome: Adequate for Discharge Goal: Will not experience complications related to urinary retention 04/10/2019 1519 by Becky Augusta, RN Outcome: Adequate for Discharge 04/10/2019 1518 by Becky Augusta, RN Outcome: Adequate for Discharge   Problem: Pain Managment: Goal: General experience of comfort will improve 04/10/2019 1519 by Becky Augusta, RN Outcome: Adequate for Discharge 04/10/2019 1518 by Becky Augusta, RN Outcome: Adequate for Discharge   Problem: Safety: Goal: Ability to remain free from injury will improve 04/10/2019 1519 by Becky Augusta, RN Outcome: Adequate for Discharge 04/10/2019 1518 by Becky Augusta, RN Outcome: Adequate for Discharge   Problem: Skin Integrity: Goal: Risk for impaired skin integrity will decrease 04/10/2019 1519 by Becky Augusta, RN Outcome: Adequate for Discharge 04/10/2019 1518 by Becky Augusta, RN Outcome: Adequate for Discharge

## 2019-04-10 NOTE — Progress Notes (Addendum)
Subjective: Jeremy Osborn. Denies any side effects from keppra so far. States his son works at [employer] and is planning to get second opinion before they decide on surgery. No new concerns.   ROS: negative except above  Examination  Vital signs in last 24 hours: Temp:  [98.2 F (36.8 C)-99.6 F (37.6 C)] 98.2 F (36.8 C) (03/17 0747) Pulse Rate:  [47-57] 47 (03/17 0747) Resp:  [14-22] 16 (03/17 0747) BP: (113-145)/(65-84) 132/69 (03/17 0747) SpO2:  [96 %-100 %] 97 % (03/17 0747)  General: lying in bed, NAD CVS: pulse-normal rate and rhythm RS: breathing comfortably Extremities: normal   Neuro: MS: Alert, oriented, follows commands CN: pupils equal and reactive,  EOMI, face symmetric, tongue midline, normal sensation over face, Motor: 5/5 strength in all 4 extremities Reflexes: 2+ bilaterally over patella, biceps, plantars: flexor Coordination: normal Gait: not tested  Basic Metabolic Panel: Recent Labs  Lab 04/09/19 0335 04/09/19 0858 04/10/19 0413  NA 141  --  138  K 4.2  --  4.0  CL 102  --  102  CO2 22  --  24  GLUCOSE 143*  --  111*  BUN 14  --  11  CREATININE 1.30*  --  1.01  CALCIUM 9.3  --  8.7*  MG  --  1.8  --     CBC: Recent Labs  Lab 04/09/19 0335 04/10/19 0413  WBC 6.0 5.7  NEUTROABS 2.7  --   HGB 13.8 13.4  HCT 44.2 41.5  MCV 92.7 89.6  PLT 274 241     Coagulation Studies: Recent Labs    04/09/19 0335  LABPROT 12.8  INR 1.0    Imaging  MRI brain w and wo contrast: 2.1 x 2.3 x 1.7 cm right temporal region extra-axial mass with broad dural base along the right tentorium. The mass demonstrates predominantly homogeneous enhancement, although there are small foci of peripheral cystic change. Localized mass effect upon the undersurface of the posteromedial right temporal lobe with mild associated parenchymal edema. Given the imaging features and broad dural base, meningioma is favored. In the absence of any known malignancy, a solitary dural-based  metastasis is considered less likely.  Nonspecific chronic microhemorrhage within the left temporal occipital lobe.  Moderate-sized left maxillary sinus mucous retention cyst. Otherwise mild paranasal sinus mucosal thickening.        ASSESSMENT AND PLAN: 54 y.o. right-handed male with past medical history of hypertension, hyperlipidemia, type 2 diabetes and exercise-induced asthma who presented after an episode of transient alteration of awareness.    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  - As needed IV Ativan only for generalized tonic-clonic seizure lasting more than 2 minutes or focal seizure lasting more than 5 minutes while inpatient - F/u with neurology in 8-12 weeks  Thank you for allowing [place] to participate the care of this patient. Neurology will sign off. Please page Neurohospitalist for any further questions after 5 PM.  I have spent a total of  25 minutes with the patient reviewing hospital notes,  test results, labs and examining the patient as well as establishing an assessment and plan that was discussed personally with the patient.  > 50% of time was spent in direct patient care.  Katieann Hungate Annabelle Harman

## 2019-04-10 NOTE — Progress Notes (Signed)
Subjective: Patient reports no headaches or seizures overnight  Objective: Vital signs in last 24 hours: Temp:  [98.2 F (36.8 C)-99.6 F (37.6 C)] 98.2 F (36.8 C) (03/17 0747) Pulse Rate:  [42-57] 47 (03/17 0747) Resp:  [14-22] 16 (03/17 0747) BP: (113-145)/(64-84) 132/69 (03/17 0747) SpO2:  [96 %-100 %] 97 % (03/17 0747)  Intake/Output from previous day: 03/16 0701 - 03/17 0700 In: 900 [I.V.:900] Out: 275 [Urine:275] Intake/Output this shift: No intake/output data recorded.  Awake, alert, Ox3 VFC No drift  Lab Results: Recent Labs    04/09/19 0335 04/10/19 0413  WBC 6.0 5.7  HGB 13.8 13.4  HCT 44.2 41.5  PLT 274 241   BMET Recent Labs    04/09/19 0335 04/10/19 0413  NA 141 138  K 4.2 4.0  CL 102 102  CO2 22 24  GLUCOSE 143* 111*  BUN 14 11  CREATININE 1.30* 1.01  CALCIUM 9.3 8.7*    Studies/Results: EEG  Result Date: 04/09/2019 Lora Havens, MD     04/09/2019  4:20 PM Patient Name: [name] MRN: [id number] Epilepsy Attending: Lora Havens Referring Physician/Provider: Dr. Zeb Comfort Date: 04/09/2019 Duration: 25.03 minutes Patient history: 54 year old male with an episode of transient alteration of awareness.  MRI brain showed right temporal region extra-axial mass concerning for meningioma with localized mass-effect on posterior medial right temporal lobe.  EEG to evaluate for seizures. Level of alertness: Awake, asleep AEDs during EEG study: None Technical aspects: This EEG study was done with scalp electrodes positioned according to the 10-20 International system of electrode placement. Electrical activity was acquired at a sampling rate of 500Hz  and reviewed with a high frequency filter of 70Hz  and a low frequency filter of 1Hz . EEG data were recorded continuously and digitally stored. Description: The posterior dominant rhythm consists of 10 Hz activity of moderate voltage (25-35 uV) seen predominantly in posterior head regions, symmetric  and reactive to eye opening and eye closing.  Sleep was characterized by vertex waves, maximal frontocentral. Hyperventilation and photic stimulation were not performed. IMPRESSION: This study is within normal limits. No seizures or epileptiform discharges were seen throughout the recording. Lora Havens   CT HEAD WO CONTRAST  Result Date: 04/09/2019 CLINICAL DATA:  Encephalopathy EXAM: CT HEAD WITHOUT CONTRAST TECHNIQUE: Contiguous axial images were obtained from the base of the skull through the vertex without intravenous contrast. COMPARISON:  None. FINDINGS: Brain: There is no acute intracranial hemorrhage. There is a small area of white matter hypoattenuation in the right temporal lobe without apparent loss of gray differentiation. There is no extra-axial fluid collection. Ventricles and sulci are within normal limits in size and configuration. Vascular: Residual contrast from recent CTA. No unexpected calcification. Skull: Calvarium is unremarkable. Sinuses/Orbits: Lobular left maxillary sinus mucosal thickening. Unremarkable. Other: None. IMPRESSION: Small area of right temporal edema. No acute intracranial hemorrhage. MRI with contrast is recommended for further evaluation. These results were called by telephone at the time of interpretation on 04/09/2019 at 10:22 am to provider [facility] , who verbally acknowledged these results. Electronically Signed   By: Macy Mis M.D.   On: 04/09/2019 10:25   CT Angio Chest PE W and/or Wo Contrast  Result Date: 04/09/2019 CLINICAL DATA:  Found unresponsive, pale, core and diaphoretic, foaming at the mouth. History of diabetes and asthma EXAM: CT ANGIOGRAPHY CHEST WITH CONTRAST TECHNIQUE: Multidetector CT imaging of the chest was performed using the standard protocol during bolus administration of intravenous contrast. Multiplanar CT image  reconstructions and MIPs were obtained to evaluate the vascular anatomy. CONTRAST:  46mL OMNIPAQUE IOHEXOL 350  MG/ML SOLN COMPARISON:  Chest radiograph 04/09/2019 FINDINGS: Cardiovascular: Satisfactory opacification the pulmonary arteries to the segmental level. No pulmonary artery filling defects are identified. Central pulmonary arteries are normal caliber. The aorta is normal caliber. Normal 3 vessel branching of the aortic arch. Mild cardiomegaly with predominantly right heart enlargement. No pericardial effusion. Mediastinum/Nodes: No mediastinal fluid or gas. Uniform appearance of the thyroid gland and thoracic inlet. No acute abnormality of the trachea or esophagus. No worrisome mediastinal, hilar or axillary adenopathy. Lungs/Pleura: No consolidation, features of edema, pneumothorax, or effusion. No suspicious pulmonary nodules or masses. Upper Abdomen: No acute abnormalities present in the visualized portions of the upper abdomen. Musculoskeletal: No acute osseous abnormality or suspicious osseous lesion. Review of the MIP images confirms the above findings. IMPRESSION: 1. No acute pulmonary artery filling defects identified to suggest pulmonary embolism. 2. No acute abnormality identified in the chest. 3. Mild cardiomegaly with predominantly right heart enlargement. Electronically Signed   By: Kreg Shropshire M.D.   On: 04/09/2019 05:51   MR BRAIN W WO CONTRAST  Result Date: 04/09/2019 CLINICAL DATA:  Seizure, nontraumatic. EXAM: MRI HEAD WITHOUT AND WITH CONTRAST TECHNIQUE: Multiplanar, multiecho pulse sequences of the brain and surrounding structures were obtained without and with intravenous contrast. CONTRAST:  67mL GADAVIST GADOBUTROL 1 MMOL/ML IV SOLN COMPARISON:  10 mL Gadavist intravenous contrast. FINDINGS: Brain: There is no evidence of acute infarct. No midline shift or extra-axial fluid collection. There is a 2.1 x 2.3 x 1.7 cm right temporal region extra-axial mass with a broad dural base along the right tentorium. Pre contrast, the mass demonstrates predominantly T1 intermediate to low signal and T2  intermediate to high signal. The mass is predominantly homogeneously enhancing, but demonstrates small peripheral foci of cystic change. There are small foci of SWI signal loss within the mass likely reflecting mineralization. There is local mass effect upon the undersurface of the posteromedial right temporal lobe with mild adjacent parenchymal edema (series 9, image 14). No additional foci of abnormal intracranial enhancement are identified. Subcentimeter focus of SWI signal loss within the left temporal occipital lobe consistent with chronic microhemorrhage (series 8, image 43) Cerebral volume is normal for age. Vascular: Flow voids maintained within the proximal large arterial vessels. Skull and upper cervical spine: No focal marrow lesion. Sinuses/Orbits: Visualized orbits demonstrate no acute abnormality. Mild scattered paranasal sinus mucosal thickening. Moderate-sized left maxillary sinus mucous retention cyst. No significant mastoid effusion. IMPRESSION: 2.1 x 2.3 x 1.7 cm right temporal region extra-axial mass with broad dural base along the right tentorium. The mass demonstrates predominantly homogeneous enhancement, although there are small foci of peripheral cystic change. Localized mass effect upon the undersurface of the posteromedial right temporal lobe with mild associated parenchymal edema. Given the imaging features and broad dural base, meningioma is favored. In the absence of any known malignancy, a solitary dural-based metastasis is considered less likely. Nonspecific chronic microhemorrhage within the left temporal occipital lobe. Moderate-sized left maxillary sinus mucous retention cyst. Otherwise mild paranasal sinus mucosal thickening. Electronically Signed   By: Jackey Loge DO   On: 04/09/2019 14:37   DG Chest Port 1 View  Result Date: 04/09/2019 CLINICAL DATA:  54 year old male with syncope. EXAM: PORTABLE CHEST 1 VIEW COMPARISON:  Chest radiograph dated 07/17/2017. FINDINGS: There  is no focal consolidation, pleural effusion or pneumothorax. The cardiac silhouette is within normal limits. No acute osseous pathology. IMPRESSION:  No active disease. Electronically Signed   By: Elgie Collard M.D.   On: 04/09/2019 04:04   ECHOCARDIOGRAM COMPLETE  Result Date: 04/09/2019    ECHOCARDIOGRAM REPORT   Patient Name:   Jeremy Osborn Date of Exam: 04/09/2019 Medical Rec #:  [id number]      Height:       71.0 in Accession #:    [id number]     Weight:       240.0 lb Date of Birth:  1965/06/24      BSA:          2.278 m Patient Age:    53 years       BP:           129/64 mmHg Patient Gender: M              HR:           50 bpm. Exam Location:  Inpatient Procedure: 2D Echo Indications:    Syncope 780.2 / R55  History:        Patient has no prior history of Echocardiogram examinations.                 Risk Factors:Hypertension, Diabetes and Dyslipidemia. Episode of                 altered awareness.  Sonographer:    Leta Jungling RDCS Referring Phys: [id number] RONDELL A SMITH IMPRESSIONS  1. Left ventricular ejection fraction, by estimation, is 55 to 60%. The left ventricle has normal function. The left ventricle has no regional wall motion abnormalities. There is moderate left ventricular hypertrophy. Left ventricular diastolic parameters are consistent with Grade I diastolic dysfunction (impaired relaxation).  2. Right ventricular systolic function is normal. The right ventricular size is normal. There is normal pulmonary artery systolic pressure. The estimated right ventricular systolic pressure is 17.3 mmHg.  3. Left atrial size was moderately dilated.  4. The mitral valve is grossly normal. Trivial mitral valve regurgitation.  5. The aortic valve is tricuspid. Aortic valve regurgitation is not visualized.  6. The inferior vena cava is normal in size with greater than 50% respiratory variability, suggesting right atrial pressure of 3 mmHg. FINDINGS  Left Ventricle: Left ventricular ejection fraction,  by estimation, is 55 to 60%. The left ventricle has normal function. The left ventricle has no regional wall motion abnormalities. The left ventricular internal cavity size was normal in size. There is  moderate left ventricular hypertrophy. Left ventricular diastolic parameters are consistent with Grade I diastolic dysfunction (impaired relaxation). Indeterminate filling pressures. Right Ventricle: The right ventricular size is normal. No increase in right ventricular wall thickness. Right ventricular systolic function is normal. There is normal pulmonary artery systolic pressure. The tricuspid regurgitant velocity is 1.89 m/s, and  with an assumed right atrial pressure of 3 mmHg, the estimated right ventricular systolic pressure is 17.3 mmHg. Left Atrium: Left atrial size was moderately dilated. Right Atrium: Right atrial size was normal in size. Pericardium: There is no evidence of pericardial effusion. Mitral Valve: The mitral valve is grossly normal. Trivial mitral valve regurgitation. Tricuspid Valve: The tricuspid valve is grossly normal. Tricuspid valve regurgitation is trivial. Aortic Valve: The aortic valve is tricuspid. Aortic valve regurgitation is not visualized. Pulmonic Valve: The pulmonic valve was grossly normal. Pulmonic valve regurgitation is trivial. Aorta: The aortic root, ascending aorta, aortic arch and descending aorta are all structurally normal, with no evidence of dilitation or obstruction. Venous: The inferior vena cava is  normal in size with greater than 50% respiratory variability, suggesting right atrial pressure of 3 mmHg. IAS/Shunts: No atrial level shunt detected by color flow Doppler.  LEFT VENTRICLE PLAX 2D LVIDd:         5.30 cm  Diastology LVIDs:         3.80 cm  LV e' lateral:   8.38 cm/s LV PW:         1.20 cm  LV E/e' lateral: 13.8 LV IVS:        1.30 cm  LV e' medial:    7.83 cm/s LVOT diam:     2.10 cm  LV E/e' medial:  14.8 LV SV:         104 LV SV Index:   45 LVOT Area:      3.46 cm  RIGHT VENTRICLE RV S prime:     13.20 cm/s TAPSE (M-mode): 2.8 cm LEFT ATRIUM             Index       RIGHT ATRIUM           Index LA diam:        4.80 cm 2.11 cm/m  RA Area:     20.90 cm LA Vol (A2C):   72.4 ml 31.78 ml/m RA Volume:   60.70 ml  26.64 ml/m LA Vol (A4C):   91.4 ml 40.12 ml/m LA Biplane Vol: 87.7 ml 38.49 ml/m  AORTIC VALVE LVOT Vmax:   136.00 cm/s LVOT Vmean:  92.400 cm/s LVOT VTI:    0.299 m  AORTA Ao Root diam: 3.40 cm MITRAL VALVE                TRICUSPID VALVE MV Area (PHT): 3.77 cm     TR Peak grad:   14.3 mmHg MV Decel Time: 201 msec     TR Vmax:        189.00 cm/s MV E velocity: 116.00 cm/s MV A velocity: 129.00 cm/s  SHUNTS MV E/A ratio:  0.90         Systemic VTI:  0.30 m                             Systemic Diam: 2.10 cm Zoila Shutter MD Electronically signed by Zoila Shutter MD Signature Date/Time: 04/09/2019/10:24:04 AM    Final     Assessment/Plan: Right tentorial dural based mass, likely meningioma with associated CP seizures. - patient not decided about surgery and will potentially seek second opinion at [facility] - I will have him f/u in clinic in 2-4 weeks for further discussion - all questions and concerns were answered   Bedelia Person 04/10/2019, 9:30 AM

## 2019-04-10 NOTE — TOC Transition Note (Signed)
Transition of Care [facility]) - CM/SW Discharge Note   Patient Details  Name: Jeremy Osborn MRN: [id number] Date of Birth: 1965/07/19  Transition [facility]) CM/SW Contact:  Kermit Balo, RN Phone Number: 04/10/2019, 2:28 PM   Clinical Narrative:    Pt discharging home with self care. Pt has hospital f/u and transportation home.    Final next level of care: Home/Self Care Barriers to Discharge: No Barriers Identified   Patient Goals and CMS Choice        Discharge Placement                       Discharge Plan and Services                                     Social Determinants of Health (SDOH) Interventions     Readmission Risk Interventions No flowsheet data found.

## 2019-04-10 NOTE — Plan of Care (Signed)

## 2019-04-14 NOTE — Discharge Summary (Signed)
Physician Discharge Summary  Jeremy Osborn YCX:[id number] DOB: 05/18/1965 DOA: 04/09/2019  PCP: Verdia Kuba, MD  Admit date: 04/09/2019 Discharge date: 04/14/2019  Recommendations for Outpatient Follow-up:  1. Follow up with neurosurgery at [facility]. 2. Follow up with Dr. Maisie Fus in 2-4 weeks. 3. No Driving. 4. Follow up with PCP in 7-10 days.  Follow-up Information    Bedelia Person, MD. Schedule an appointment as soon as possible for a visit in 2 week(s).   Specialty: Neurosurgery Contact information: [address] [place] [address] [phone]          Discharge Diagnoses: Principal diagnosis is #1 1. Transient altered awareness 2. Intracranial mass with edema 3. Acute kidney injury 4. Elevated anion gap 5. Essential hypertension 6. DM II 7. History of asthma 8. Hyperlipidemia 9. Obesity  Discharge Condition: Fair  Disposition: Home  Diet recommendation: Heart healthy and carbohydrate controlled.  Filed Weights   04/09/19 0332  Weight: 108.9 kg    History of present illness:   Jeremy Osborn is a 54 y.o. male with medical history significant of hypertension, hyperlipidemia, diabetes mellitus type 2, and exercise induced asthma who presents after an episode of altered awareness.  History is limited from the patient as he has no recollection of the events and his wife contributes additional history.  He had been in his normal state of health and went to bed around 11:30 PM last night.  His wife was woken up around 2 AM to him hitting her. She initially thought that he was just having a bad dream.  Reports that he was making gasping sounds for air.  She is able to get him to sit up and tried to stand him, but he fell into the wall hitting his head.  He tried getting up once again and fell to the ground.  Patient complains of some left knee pain and tenderness.  No loss of bowel or bladder reported.  She did not witness any tongue biting and she never  saw him lose consciousness.  Patient was staring off as though he did not know what was going on.  He recently received his first Covid shot 10 days ago and had a colonoscopy 8 days ago.   He has had no previous history of heart issues and notes that his heart rates normally are in the 50s since he started riding a bicycle 6 years ago.  Denies having any recent nausea, vomiting, diarrhea, abdominal pain, fever, chills, chest pain, shortness of breath, or cough.  ED Course: Upon admission into the emergency department patient was seen as a code STEMI as initial EKG revealed ST wave elevation in the anterior leads, but this was later canceled after cardiology consulted.  Patient was afebrile, pulse 43-65, blood pressures 124/84-171/82 and all other vital signs maintained.  Labs significant for CO2 22, creatinine 1.3, glucose 143, anion gap 17, D-dimer 1.53, hemoglobin A1c 6.2, and high-sensitivity troponin negative.  Chest x-ray was clear and CT angiogram  of the chest was negative for a pulmonary embolus, but did note mild cardiomegaly with right heart enlargement.  Influenza and coronavirus screening were negative.  TRH called to admit.  Hospital Course:  The patient was admitted to a telemetry bed. CT head demonstrated a small area of temporal edema. There was no acute intracranial hemorrhage. MRI with contrast was them obtained which demonstrated a 2.1 x 2.3 x 1.7 cm right temporal region extra-axial mass with braod dural base along the right  tentorium. There was mass effect upon the undersurface of the posteromedial right temporal lobe with mild associated parenchymal edema. Meningioma was favored. Solitary dural-based metastasis in the setting of no known malignancy is less likely.  Neurosurgery was consulted. The patient was evaluated by neurosurgery. The offered the patient surgical excision of the mass. The patient and his son who works at [employer] [facility] [employer] [employer] [facility] wished to go there for a second  opinion. The patient was discharged to home on orla keppra 500 mg bid.  Today's assessment: S: The patient is resting comfortably. O: Vitals:  Vitals:   04/10/19 0747 04/10/19 1328  BP: 132/69 133/74  Pulse: (!) 47 (!) 54  Resp: 16 18  Temp: 98.2 F (36.8 C) 99 F (37.2 C)  SpO2: 97% 99%   Exam:  Constitutional:  . The patient is awake, alert, and oriented x 3. No acute distress. Respiratory:  . No increased work of breathing. . No wheezes, rales, or rhonchi . No tactile fremitus Cardiovascular:  . Regular rate and rhythm . No murmurs, ectopy, or gallups. . No lateral PMI. No thrills. Abdomen:  . Abdomen is soft, non-tender, non-distended . No hernias, masses, or organomegaly . Normoactive bowel sounds.  Musculoskeletal:  . No cyanosis, clubbing, or edema Skin:  . No rashes, lesions, ulcers . palpation of skin: no induration or nodules Neurologic:  . CN 2-12 intact . Sensation all 4 extremities intact Psychiatric:  . Mental status o Mood, affect appropriate o Orientation to person, place, time  . judgment and insight appear intact .  Discharge Instructions  Discharge Instructions    Call MD for:   Complete by: As directed    Seizures   Call MD for:  persistant dizziness or light-headedness   Complete by: As directed    Call MD for:  persistant nausea and vomiting   Complete by: As directed    Diet - low sodium heart healthy   Complete by: As directed    Discharge instructions   Complete by: As directed    Follow up with neurosurgery at [facility]. Follow up with Dr. Maisie Fus in 2-4 weeks. No Driving. Follow up with PCP in 7-10 days.   Driving Restrictions   Complete by: As directed    No driving   Increase activity slowly   Complete by: As directed      Allergies as of 04/10/2019   No Known Allergies     Medication List    STOP taking these medications   ibuprofen 200 MG tablet Commonly known as: ADVIL     TAKE these medications   albuterol  108 (90 Base) MCG/ACT inhaler Commonly known as: VENTOLIN HFA Inhale 1-2 puffs into the lungs every 6 (six) hours as needed for wheezing or shortness of breath.   atorvastatin 10 MG tablet Commonly known as: LIPITOR Take 20 mg by mouth daily.   enalapril 5 MG tablet Commonly known as: VASOTEC Take 5 mg by mouth daily.   levETIRAcetam 500 MG tablet Commonly known as: KEPPRA Take 1 tablet (500 mg total) by mouth 2 (two) times daily.   metFORMIN 500 MG tablet Commonly known as: GLUCOPHAGE Take 500 mg by mouth 2 (two) times daily with a meal.   multivitamin with minerals tablet Take 1 tablet by mouth daily.      No Known Allergies  The results of significant diagnostics from this hospitalization (including imaging, microbiology, ancillary and laboratory) are listed below for reference.    Significant Diagnostic Studies: EEG  Result  Date: 04/09/2019 Charlsie Quest, MD     04/09/2019  4:20 PM Patient Name: JEMUEL LAURSEN MRN: [id number] Epilepsy Attending: Charlsie Quest Referring Physician/Provider: Dr. Lindie Spruce Date: 04/09/2019 Duration: 25.03 minutes Patient history: 54 year old male with an episode of transient alteration of awareness.  MRI brain showed right temporal region extra-axial mass concerning for meningioma with localized mass-effect on posterior medial right temporal lobe.  EEG to evaluate for seizures. Level of alertness: Awake, asleep AEDs during EEG study: None Technical aspects: This EEG study was done with scalp electrodes positioned according to the 10-20 International system of electrode placement. Electrical activity was acquired at a sampling rate of  and reviewed with a high frequency filter of  and a low frequency filter of . EEG data were recorded continuously and digitally stored. Description: The posterior dominant rhythm consists of 10 Hz activity of moderate voltage (25-35 uV) seen predominantly in posterior head regions, symmetric and  reactive to eye opening and eye closing.  Sleep was characterized by vertex waves, maximal frontocentral. Hyperventilation and photic stimulation were not performed. IMPRESSION: This study is within normal limits. No seizures or epileptiform discharges were seen throughout the recording. Charlsie Quest   CT HEAD WO CONTRAST  Result Date: 04/09/2019 CLINICAL DATA:  Encephalopathy EXAM: CT HEAD WITHOUT CONTRAST TECHNIQUE: Contiguous axial images were obtained from the base of the skull through the vertex without intravenous contrast. COMPARISON:  None. FINDINGS: Brain: There is no acute intracranial hemorrhage. There is a small area of white matter hypoattenuation in the right temporal lobe without apparent loss of gray differentiation. There is no extra-axial fluid collection. Ventricles and sulci are within normal limits in size and configuration. Vascular: Residual contrast from recent CTA. No unexpected calcification. Skull: Calvarium is unremarkable. Sinuses/Orbits: Lobular left maxillary sinus mucosal thickening. Unremarkable. Other: None. IMPRESSION: Small area of right temporal edema. No acute intracranial hemorrhage. MRI with contrast is recommended for further evaluation. These results were called by telephone at the time of interpretation on 04/09/2019 at 10:22 am to provider [facility] , who verbally acknowledged these results. Electronically Signed   By: Guadlupe Spanish M.D.   On: 04/09/2019 10:25   CT Angio Chest PE W and/or Wo Contrast  Result Date: 04/09/2019 CLINICAL DATA:  Found unresponsive, pale, core and diaphoretic, foaming at the mouth. History of diabetes and asthma EXAM: CT ANGIOGRAPHY CHEST WITH CONTRAST TECHNIQUE: Multidetector CT imaging of the chest was performed using the standard protocol during bolus administration of intravenous contrast. Multiplanar CT image reconstructions and MIPs were obtained to evaluate the vascular anatomy. CONTRAST:  75mL OMNIPAQUE IOHEXOL 350 MG/ML  SOLN COMPARISON:  Chest radiograph 04/09/2019 FINDINGS: Cardiovascular: Satisfactory opacification the pulmonary arteries to the segmental level. No pulmonary artery filling defects are identified. Central pulmonary arteries are normal caliber. The aorta is normal caliber. Normal 3 vessel branching of the aortic arch. Mild cardiomegaly with predominantly right heart enlargement. No pericardial effusion. Mediastinum/Nodes: No mediastinal fluid or gas. Uniform appearance of the thyroid gland and thoracic inlet. No acute abnormality of the trachea or esophagus. No worrisome mediastinal, hilar or axillary adenopathy. Lungs/Pleura: No consolidation, features of edema, pneumothorax, or effusion. No suspicious pulmonary nodules or masses. Upper Abdomen: No acute abnormalities present in the visualized portions of the upper abdomen. Musculoskeletal: No acute osseous abnormality or suspicious osseous lesion. Review of the MIP images confirms the above findings. IMPRESSION: 1. No acute pulmonary artery filling defects identified to suggest pulmonary embolism. 2. No acute abnormality identified in  the chest. 3. Mild cardiomegaly with predominantly right heart enlargement. Electronically Signed   By: Kreg Shropshire M.D.   On: 04/09/2019 05:51   MR BRAIN W WO CONTRAST  Result Date: 04/09/2019 CLINICAL DATA:  Seizure, nontraumatic. EXAM: MRI HEAD WITHOUT AND WITH CONTRAST TECHNIQUE: Multiplanar, multiecho pulse sequences of the brain and surrounding structures were obtained without and with intravenous contrast. CONTRAST:  10mL GADAVIST GADOBUTROL 1 MMOL/ML IV SOLN COMPARISON:  10 mL Gadavist intravenous contrast. FINDINGS: Brain: There is no evidence of acute infarct. No midline shift or extra-axial fluid collection. There is a 2.1 x 2.3 x 1.7 cm right temporal region extra-axial mass with a broad dural base along the right tentorium. Pre contrast, the mass demonstrates predominantly T1 intermediate to low signal and T2  intermediate to high signal. The mass is predominantly homogeneously enhancing, but demonstrates small peripheral foci of cystic change. There are small foci of SWI signal loss within the mass likely reflecting mineralization. There is local mass effect upon the undersurface of the posteromedial right temporal lobe with mild adjacent parenchymal edema (series 9, image 14). No additional foci of abnormal intracranial enhancement are identified. Subcentimeter focus of SWI signal loss within the left temporal occipital lobe consistent with chronic microhemorrhage (series 8, image 43) Cerebral volume is normal for age. Vascular: Flow voids maintained within the proximal large arterial vessels. Skull and upper cervical spine: No focal marrow lesion. Sinuses/Orbits: Visualized orbits demonstrate no acute abnormality. Mild scattered paranasal sinus mucosal thickening. Moderate-sized left maxillary sinus mucous retention cyst. No significant mastoid effusion. IMPRESSION: 2.1 x 2.3 x 1.7 cm right temporal region extra-axial mass with broad dural base along the right tentorium. The mass demonstrates predominantly homogeneous enhancement, although there are small foci of peripheral cystic change. Localized mass effect upon the undersurface of the posteromedial right temporal lobe with mild associated parenchymal edema. Given the imaging features and broad dural base, meningioma is favored. In the absence of any known malignancy, a solitary dural-based metastasis is considered less likely. Nonspecific chronic microhemorrhage within the left temporal occipital lobe. Moderate-sized left maxillary sinus mucous retention cyst. Otherwise mild paranasal sinus mucosal thickening. Electronically Signed   By: Jackey Loge DO   On: 04/09/2019 14:37   DG Chest Port 1 View  Result Date: 04/09/2019 CLINICAL DATA:  54 year old male with syncope. EXAM: PORTABLE CHEST 1 VIEW COMPARISON:  Chest radiograph dated 07/17/2017. FINDINGS: There  is no focal consolidation, pleural effusion or pneumothorax. The cardiac silhouette is within normal limits. No acute osseous pathology. IMPRESSION: No active disease. Electronically Signed   By: Elgie Collard M.D.   On: 04/09/2019 04:04   ECHOCARDIOGRAM COMPLETE  Result Date: 04/09/2019    ECHOCARDIOGRAM REPORT   Patient Name:   [name] Date of Exam: 04/09/2019 Medical Rec #:  [id number]      Height:       71.0 in Accession #:    [id number]     Weight:       240.0 lb Date of Birth:  07-01-1965      BSA:          2.278 m Patient Age:    53 years       BP:           129/64 mmHg Patient Gender: M              HR:           50 bpm. Exam Location:  Inpatient Procedure: 2D Echo Indications:  Syncope 780.2 / R55  History:        Patient has no prior history of Echocardiogram examinations.                 Risk Factors:Hypertension, Diabetes and Dyslipidemia. Episode of                 altered awareness.  Sonographer:    Darlina Sicilian RDCS Referring Phys: [id number] Caroleen  1. Left ventricular ejection fraction, by estimation, is 55 to 60%. The left ventricle has normal function. The left ventricle has no regional wall motion abnormalities. There is moderate left ventricular hypertrophy. Left ventricular diastolic parameters are consistent with Grade I diastolic dysfunction (impaired relaxation).  2. Right ventricular systolic function is normal. The right ventricular size is normal. There is normal pulmonary artery systolic pressure. The estimated right ventricular systolic pressure is 08.1 mmHg.  3. Left atrial size was moderately dilated.  4. The mitral valve is grossly normal. Trivial mitral valve regurgitation.  5. The aortic valve is tricuspid. Aortic valve regurgitation is not visualized.  6. The inferior vena cava is normal in size with greater than 50% respiratory variability, suggesting right atrial pressure of 3 mmHg. FINDINGS  Left Ventricle: Left ventricular ejection fraction,  by estimation, is 55 to 60%. The left ventricle has normal function. The left ventricle has no regional wall motion abnormalities. The left ventricular internal cavity size was normal in size. There is  moderate left ventricular hypertrophy. Left ventricular diastolic parameters are consistent with Grade I diastolic dysfunction (impaired relaxation). Indeterminate filling pressures. Right Ventricle: The right ventricular size is normal. No increase in right ventricular wall thickness. Right ventricular systolic function is normal. There is normal pulmonary artery systolic pressure. The tricuspid regurgitant velocity is 1.89 m/s, and  with an assumed right atrial pressure of 3 mmHg, the estimated right ventricular systolic pressure is 44.8 mmHg. Left Atrium: Left atrial size was moderately dilated. Right Atrium: Right atrial size was normal in size. Pericardium: There is no evidence of pericardial effusion. Mitral Valve: The mitral valve is grossly normal. Trivial mitral valve regurgitation. Tricuspid Valve: The tricuspid valve is grossly normal. Tricuspid valve regurgitation is trivial. Aortic Valve: The aortic valve is tricuspid. Aortic valve regurgitation is not visualized. Pulmonic Valve: The pulmonic valve was grossly normal. Pulmonic valve regurgitation is trivial. Aorta: The aortic root, ascending aorta, aortic arch and descending aorta are all structurally normal, with no evidence of dilitation or obstruction. Venous: The inferior vena cava is normal in size with greater than 50% respiratory variability, suggesting right atrial pressure of 3 mmHg. IAS/Shunts: No atrial level shunt detected by color flow Doppler.  LEFT VENTRICLE PLAX 2D LVIDd:         5.30 cm  Diastology LVIDs:         3.80 cm  LV e' lateral:   8.38 cm/s LV PW:         1.20 cm  LV E/e' lateral: 13.8 LV IVS:        1.30 cm  LV e' medial:    7.83 cm/s LVOT diam:     2.10 cm  LV E/e' medial:  14.8 LV SV:         104 LV SV Index:   45 LVOT Area:      3.46 cm  RIGHT VENTRICLE RV S prime:     13.20 cm/s TAPSE (M-mode): 2.8 cm LEFT ATRIUM  Index       RIGHT ATRIUM           Index LA diam:        4.80 cm 2.11 cm/m  RA Area:     20.90 cm LA Vol (A2C):   72.4 ml 31.78 ml/m RA Volume:   60.70 ml  26.64 ml/m LA Vol (A4C):   91.4 ml 40.12 ml/m LA Biplane Vol: 87.7 ml 38.49 ml/m  AORTIC VALVE LVOT Vmax:   136.00 cm/s LVOT Vmean:  92.400 cm/s LVOT VTI:    0.299 m  AORTA Ao Root diam: 3.40 cm MITRAL VALVE                TRICUSPID VALVE MV Area (PHT): 3.77 cm     TR Peak grad:   14.3 mmHg MV Decel Time: 201 msec     TR Vmax:        189.00 cm/s MV E velocity: 116.00 cm/s MV A velocity: 129.00 cm/s  SHUNTS MV E/A ratio:  0.90         Systemic VTI:  0.30 m                             Systemic Diam: 2.10 cm Zoila Shutter MD Electronically signed by Zoila Shutter MD Signature Date/Time: 04/09/2019/10:24:04 AM    Final     Microbiology: Recent Results (from the past 240 hour(s))  Respiratory Panel by RT PCR (Flu A&B, Covid) - Nasopharyngeal Swab     Status: None   Collection Time: 04/09/19  3:40 AM   Specimen: Nasopharyngeal Swab  Result Value Ref Range Status   SARS Coronavirus 2 by RT PCR NEGATIVE NEGATIVE Final    Comment: (NOTE) SARS-CoV-2 target nucleic acids are NOT DETECTED. The SARS-CoV-2 RNA is generally detectable in upper respiratoy specimens during the acute phase of infection. The lowest concentration of SARS-CoV-2 viral copies this assay can detect is 131 copies/mL. A negative result does not preclude SARS-Cov-2 infection and should not be used as the sole basis for treatment or other patient management decisions. A negative result may occur with  improper specimen collection/handling, submission of specimen other than nasopharyngeal swab, presence of viral mutation(s) within the areas targeted by this assay, and inadequate number of viral copies (<131 copies/mL). A negative result must be combined with clinical observations,  patient history, and epidemiological information. The expected result is Negative. Fact Sheet for Patients:  https://www.moore.com/ Fact Sheet for Healthcare Providers:  https://www.young.biz/ This test is not yet ap proved or cleared by the [place] FDA and  has been authorized for detection and/or diagnosis of SARS-CoV-2 by FDA under an Emergency Use Authorization (EUA). This EUA will remain  in effect (meaning this test can be used) for the duration of the COVID-19 declaration under Section 564(b)(1) of the Act, 21 U.S.C. section 360bbb-3(b)(1), unless the authorization is terminated or revoked sooner.    Influenza A by PCR NEGATIVE NEGATIVE Final   Influenza B by PCR NEGATIVE NEGATIVE Final    Comment: (NOTE) The Xpert Xpress SARS-CoV-2/FLU/RSV assay is intended as an aid in  the diagnosis of influenza from Nasopharyngeal swab specimens and  should not be used as a sole basis for treatment. Nasal washings and  aspirates are unacceptable for Xpert Xpress SARS-CoV-2/FLU/RSV  testing. Fact Sheet for Patients: https://www.moore.com/ Fact Sheet for Healthcare Providers: https://www.young.biz/ This test is not yet approved or cleared by the [place] FDA and  has  been authorized for detection and/or diagnosis of SARS-CoV-2 by  FDA under an Emergency Use Authorization (EUA). This EUA will remain  in effect (meaning this test can be used) for the duration of the  Covid-19 declaration under Section 564(b)(1) of the Act, 21  U.S.C. section 360bbb-3(b)(1), unless the authorization is  terminated or revoked. Performed at [facility], [address] [place], [place] [address]      Labs: Basic Metabolic Panel: Recent Labs  Lab 04/09/19 0335 04/09/19 0858 04/10/19 0413  NA 141  --  138  K 4.2  --  4.0  CL 102  --  102  CO2 22  --  24  GLUCOSE 143*  --  111*  BUN 14  --  11  CREATININE  1.30*  --  1.01  CALCIUM 9.3  --  8.7*  MG  --  1.8  --    Liver Function Tests: Recent Labs  Lab 04/09/19 0335  AST 28  ALT 23  ALKPHOS 69  BILITOT 0.8  PROT 7.0  ALBUMIN 3.8   No results for input(s): LIPASE, AMYLASE in the last 168 hours. No results for input(s): AMMONIA in the last 168 hours. CBC: Recent Labs  Lab 04/09/19 0335 04/10/19 0413  WBC 6.0 5.7  NEUTROABS 2.7  --   HGB 13.8 13.4  HCT 44.2 41.5  MCV 92.7 89.6  PLT 274 241   Cardiac Enzymes: No results for input(s): CKTOTAL, CKMB, CKMBINDEX, TROPONINI in the last 168 hours. BNP: BNP (last 3 results) No results for input(s): BNP in the last 8760 hours.  ProBNP (last 3 results) No results for input(s): PROBNP in the last 8760 hours.  CBG: Recent Labs  Lab 04/09/19 1356 04/09/19 1722 04/09/19 2140 04/10/19 0559 04/10/19 1128  GLUCAP 78 93 129* 111* 122*    Principal Problem:   Altered awareness, transient Active Problems:   Exercise induced bronchospasm   AKI (acute kidney injury) (HCC)   Essential hypertension   Type 2 diabetes mellitus with hyperlipidemia (HCC)   Time coordinating discharge: 38 minutes  Signed:        Ladaja Yusupov, DO [facility]  04/14/2019, 4:45 PM

## 2021-09-02 IMAGING — CT CT HEAD W/O CM
4 series · 16 of 47 positions shown, 18 images · non-contrast
Comparison: None.

CLINICAL DATA: Encephalopathy

EXAM:
CT HEAD WITHOUT CONTRAST
TECHNIQUE: Contiguous axial images were obtained from the base of the skull
through the vertex without intravenous contrast.

[Series 3: head wo · axial · 0.46mm/px · z∈[+1260,+1380]mm · 7 of 34 slices shown, 9 images]
[im 5/34  brain]
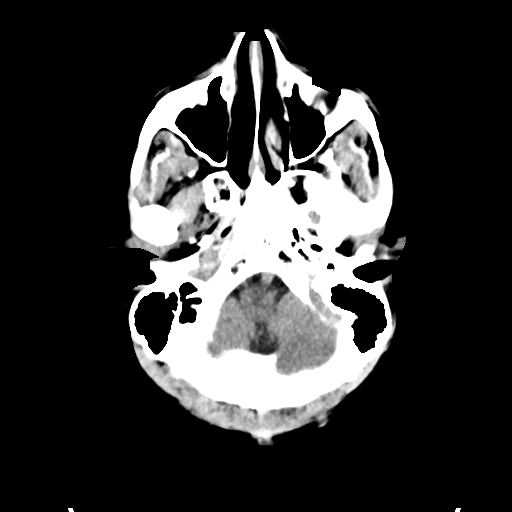
[im 5/34  bone]
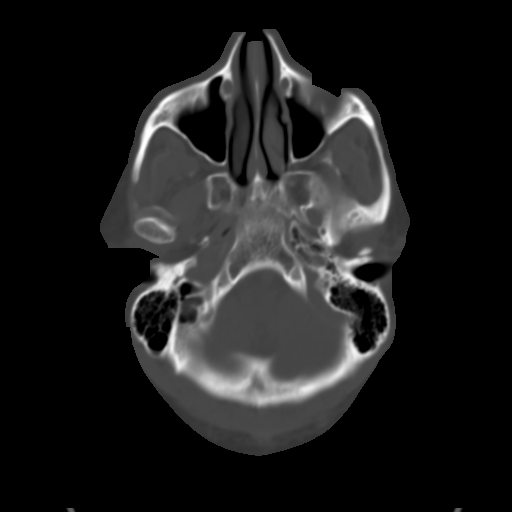
[im 9/34  brain]
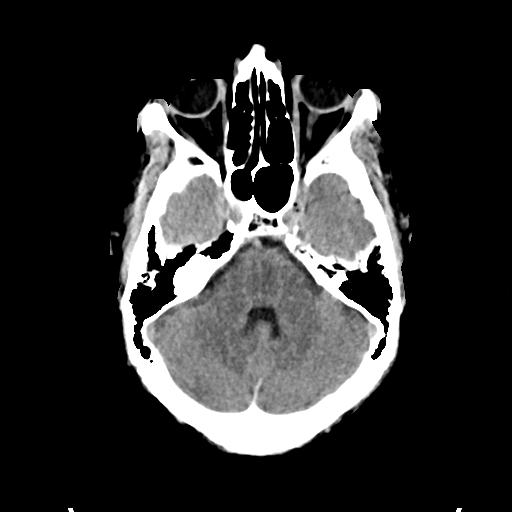
[im 13/34  brain]
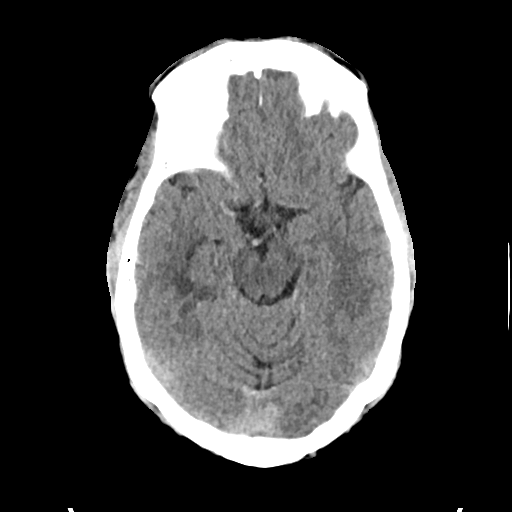
[im 17/34  brain]
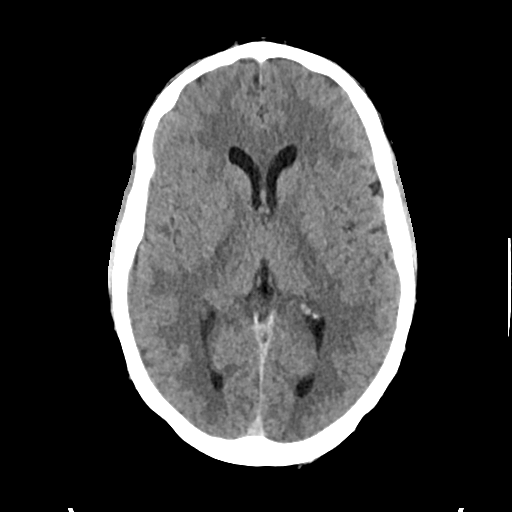
[im 21/34  brain]
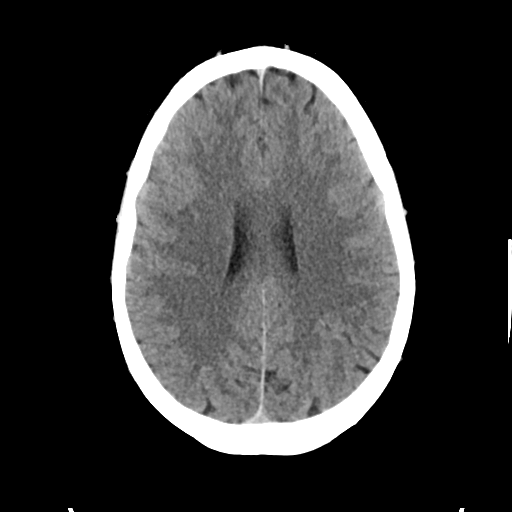
[im 21/34  bone]
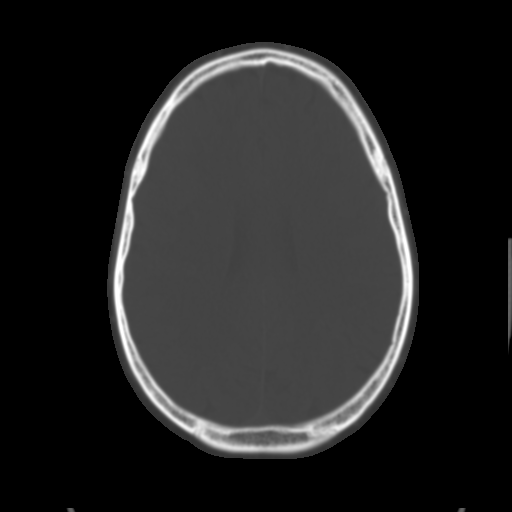
[im 25/34  brain]
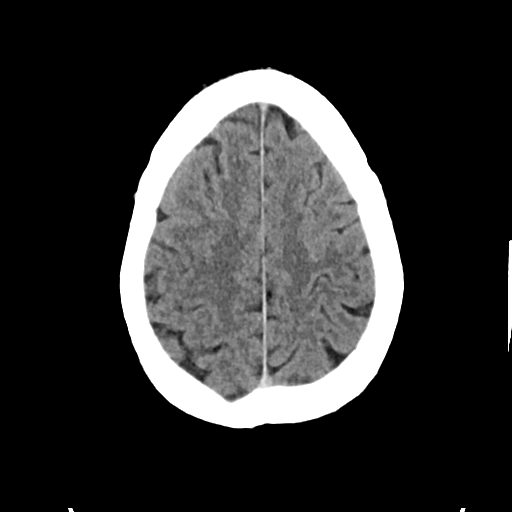
[im 29/34  brain]
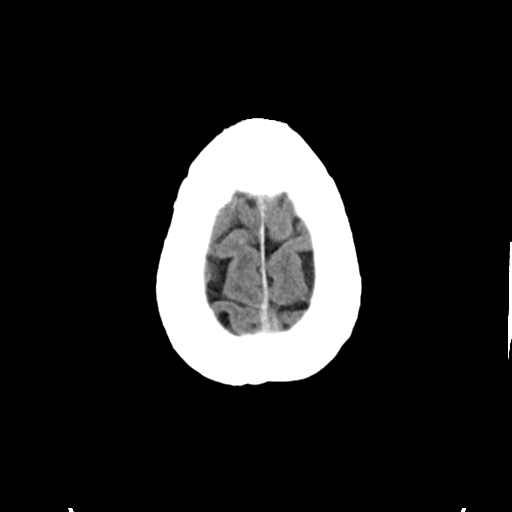

[Series 4: head bone · axial · 0.46mm/px · z∈[+1256,+1290]mm · 3 of 85 slices shown]
[im 9/85  bone]
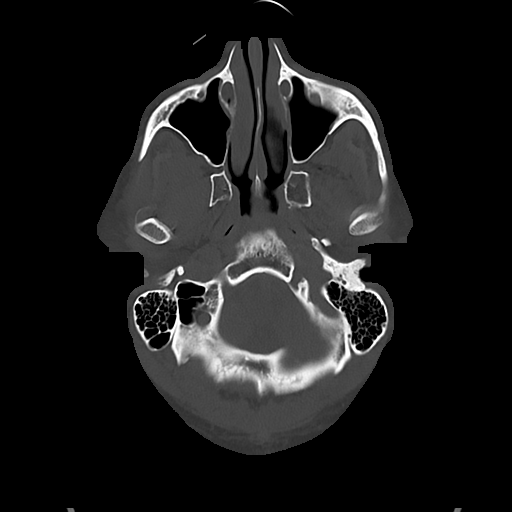
[im 17/85  bone]
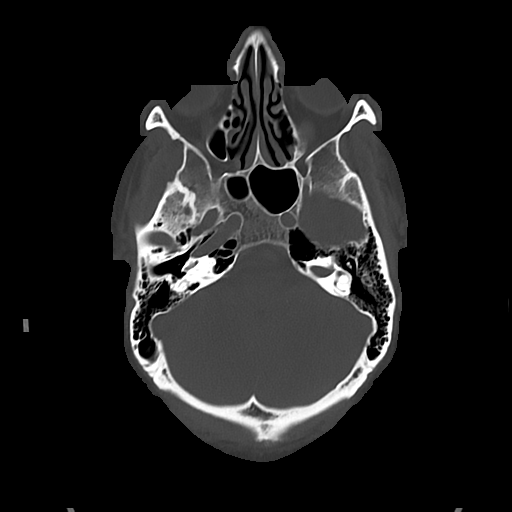
[im 26/85  bone]
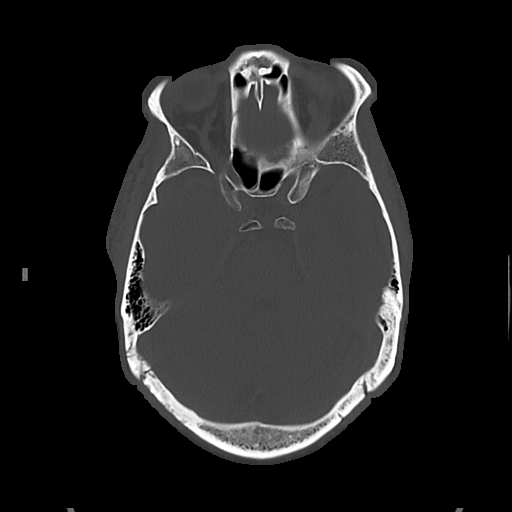

[Series 5: cor soft · coronal · 0.33mm/px · 3 of 76 slices shown]
[im 26/76  brain]
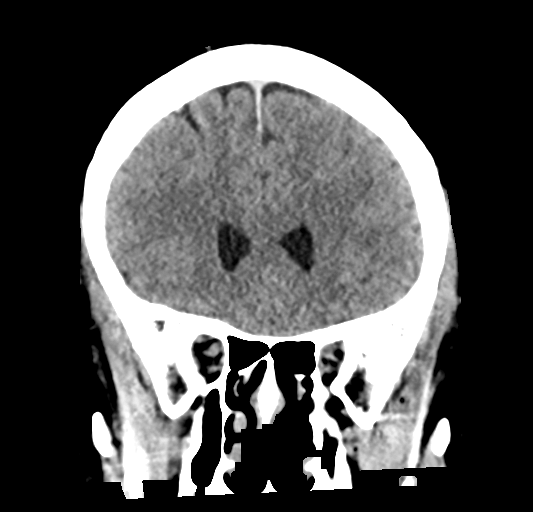
[im 34/76  brain]
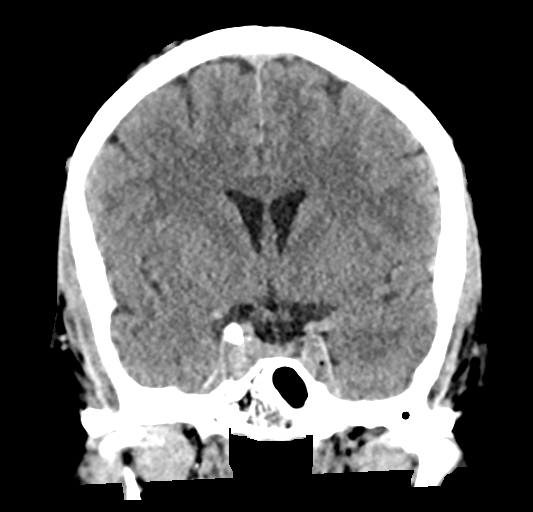
[im 42/76  brain]
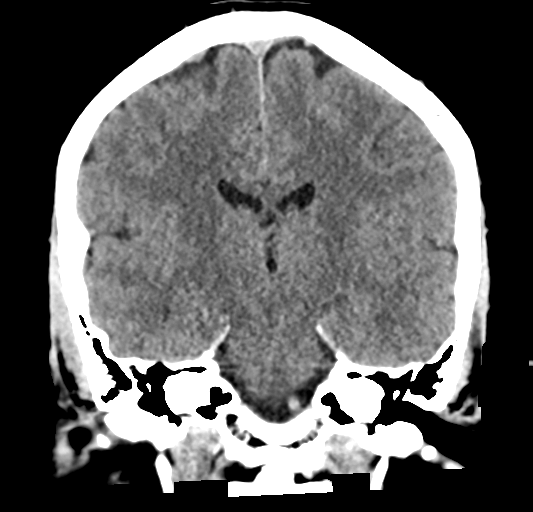

[Series 6: sag soft · sagittal · 0.33mm/px · 3 of 58 slices shown]
[im 20/58  brain]
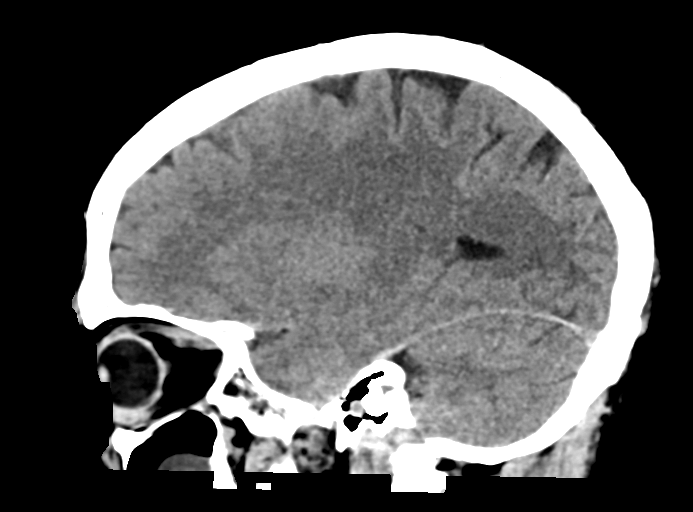
[im 29/58  brain]
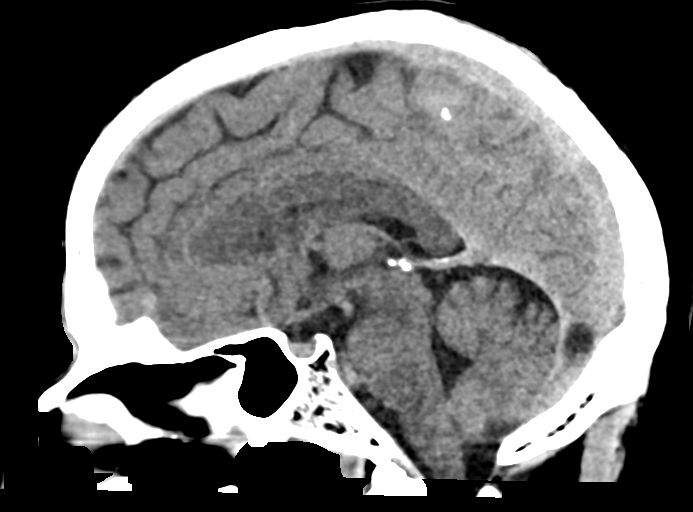
[im 39/58  brain]
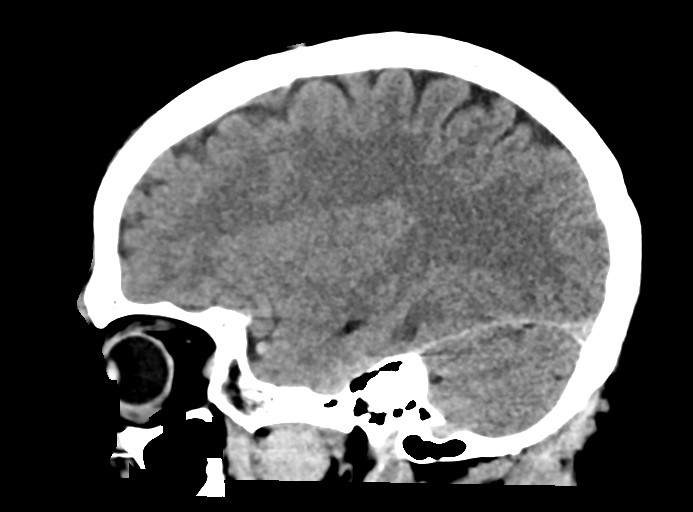

[16 of 47 positions shown; findings below may reference images not displayed]

FINDINGS: Brain: There is no acute intracranial hemorrhage. There is a small
area of white matter hypoattenuation in the right temporal lobe
without apparent loss of gray differentiation. There is no
extra-axial fluid collection. Ventricles and sulci are within normal
limits in size and configuration.

Vascular: Residual contrast from recent CTA. No unexpected
calcification.

Skull: Calvarium is unremarkable.

Sinuses/Orbits: Lobular left maxillary sinus mucosal thickening.
Unremarkable.

Other: None.
IMPRESSION: Small area of right temporal edema. No acute intracranial
hemorrhage. MRI with contrast is recommended for further evaluation.

These results were called by telephone at the time of interpretation
acknowledged these results.

## 2022-10-15 ENCOUNTER — Ambulatory Visit (HOSPITAL_COMMUNITY)
Admission: EM | Admit: 2022-10-15 | Discharge: 2022-10-15 | Disposition: A | Discharge status: Home / Self Care | Payer: 59 | Attending: Internal Medicine | Admitting: Internal Medicine

## 2022-10-15 ENCOUNTER — Encounter (HOSPITAL_COMMUNITY): Payer: Self-pay | Admitting: *Deleted

## 2022-10-15 ENCOUNTER — Other Ambulatory Visit: Payer: Self-pay

## 2022-10-15 DIAGNOSIS — Z1152 Encounter for screening for COVID-19: Secondary | ICD-10-CM | POA: Diagnosis not present

## 2022-10-15 DIAGNOSIS — R509 Fever, unspecified: Secondary | ICD-10-CM | POA: Insufficient documentation

## 2022-10-15 DIAGNOSIS — B349 Viral infection, unspecified: Secondary | ICD-10-CM | POA: Insufficient documentation

## 2022-10-15 NOTE — ED Triage Notes (Signed)
Pt reports chills.general body aches started Thursday night.

## 2022-10-15 NOTE — ED Provider Notes (Signed)
[facility]    CSN: [id number] Arrival date & time: 10/15/22  1539      History   Chief Complaint Chief Complaint  Patient presents with   Chills   Generalized Body Aches    HPI Jeremy Osborn is a 57 y.o. male.   57 year old male who presents to urgent care with complaints of generalized muscle aches and diarrhea.  The patient reports on Thursday he started feeling poorly with feeling hot and cold.  He then developed generalized joint pain.  Today he developed some diarrhea.  He denies any nausea or vomiting but does relate having a poor appetite over the last several days.  He has had no known sick contacts however he did take a day trip on a train from [place] to [place].  He is a cyclist.      Past Medical History:  Diagnosis Date   Asthma    Diabetes mellitus without complication (HCC)    Hyperlipidemia     Patient Active Problem List   Diagnosis Date Noted   Syncope 04/09/2019   Altered awareness, transient 04/09/2019   AKI (acute kidney injury) (HCC) 04/09/2019   Essential hypertension 04/09/2019   Type 2 diabetes mellitus with hyperlipidemia (HCC) 04/09/2019   Cough 07/17/2017   Exercise induced bronchospasm 07/17/2017    History reviewed. No pertinent surgical history.     Home Medications    Prior to Admission medications   Medication Sig Start Date End Date Taking? Authorizing Provider  albuterol (PROVENTIL HFA;VENTOLIN HFA) 108 (90 BASE) MCG/ACT inhaler Inhale 1-2 puffs into the lungs every 6 (six) hours as needed for wheezing or shortness of breath. 06/12/13  Yes Mabe, Onalee Hua, NP  atorvastatin (LIPITOR) 10 MG tablet Take 20 mg by mouth daily.   Yes [provider]  enalapril (VASOTEC) 5 MG tablet Take 5 mg by mouth daily. 03/09/19  Yes [provider]  levETIRAcetam (KEPPRA) 500 MG tablet Take 1 tablet (500 mg total) by mouth 2 (two) times daily. 04/10/19  Yes Swayze, Ava, DO  metFORMIN (GLUCOPHAGE) 500 MG tablet Take  500 mg by mouth 2 (two) times daily with a meal.    Yes [provider]  Multiple Vitamins-Minerals (MULTIVITAMIN WITH MINERALS) tablet Take 1 tablet by mouth daily.   Yes [provider]    Family History Family History  Problem Relation Age of Onset   Diabetes Mother    Diabetes Father     Social History Social History   Tobacco Use   Smoking status: Never   Smokeless tobacco: Never  Vaping Use   Vaping status: Never Used  Substance Use Topics   Alcohol use: No   Drug use: No     Allergies   Patient has no known allergies.   Review of Systems Review of Systems  Constitutional:  Positive for appetite change (Poor), chills and fever.  HENT:  Negative for congestion, ear pain, sinus pressure, sinus pain and sore throat.   Eyes:  Negative for pain and visual disturbance.  Respiratory:  Positive for cough (Thursday only). Negative for shortness of breath.   Cardiovascular:  Negative for chest pain and palpitations.  Gastrointestinal:  Positive for diarrhea. Negative for abdominal pain (generalized) and vomiting.  Genitourinary:  Negative for difficulty urinating, dysuria, frequency and hematuria.  Musculoskeletal:  Positive for arthralgias. Negative for back pain.  Skin:  Negative for color change and rash.  Neurological:  Negative for seizures, syncope and headaches.  All other systems reviewed and  are negative.    Physical Exam Triage Vital Signs ED Triage Vitals  Encounter Vitals Group     BP 10/15/22 1632 121/71     Systolic BP Percentile --      Diastolic BP Percentile --      Pulse Rate 10/15/22 1632 (!) 44     Resp 10/15/22 1632 20     Temp 10/15/22 1632 99.5 F (37.5 C)     Temp src --      SpO2 10/15/22 1632 95 %     Weight --      Height --      Head Circumference --      Peak Flow --      Pain Score 10/15/22 1628 8     Pain Loc --      Pain Education --      Exclude from Growth Chart --    No data found.  Updated Vital  Signs BP 121/71   Pulse (!) 44   Temp 99.5 F (37.5 C)   Resp 20   SpO2 95%   Visual Acuity Right Eye Distance:   Left Eye Distance:   Bilateral Distance:    Right Eye Near:   Left Eye Near:    Bilateral Near:     Physical Exam Vitals and nursing note reviewed.  Constitutional:      General: He is not in acute distress.    Appearance: He is well-developed.  HENT:     Head: Normocephalic and atraumatic.     Right Ear: Tympanic membrane normal.     Left Ear: Tympanic membrane normal.     Nose: Nose normal.     Mouth/Throat:     Mouth: Mucous membranes are moist.     Pharynx: Oropharynx is clear.  Eyes:     Conjunctiva/sclera: Conjunctivae normal.  Cardiovascular:     Rate and Rhythm: Normal rate and regular rhythm.     Heart sounds: No murmur heard. Pulmonary:     Effort: Pulmonary effort is normal. No respiratory distress.     Breath sounds: Normal breath sounds.  Abdominal:     Palpations: Abdomen is soft.     Tenderness: There is no abdominal tenderness.  Musculoskeletal:        General: No swelling.     Cervical back: Neck supple.  Skin:    General: Skin is warm and dry.     Capillary Refill: Capillary refill takes less than 2 seconds.  Neurological:     General: No focal deficit present.     Mental Status: He is alert.  Psychiatric:        Mood and Affect: Mood normal.      UC Treatments / Results  Labs (all labs ordered are listed, but only abnormal results are displayed) Labs Reviewed - No data to display  EKG   Radiology No results found.  Procedures Procedures (including critical care time)  Medications Ordered in UC Medications - No data to display  Initial Impression / Assessment and Plan / UC Course  I have reviewed the triage vital signs and the nursing notes.  Pertinent labs & imaging results that were available during my care of the patient were reviewed by me and considered in my medical decision making (see chart for  details).    Viral syndrome: Patient has generalized symptoms with a very low-grade fever.  We have swabbed him today for COVID and advised him that his test results will be available in  24 hours.  We have given him a link to set up his MyChart.  For now we will recommend that he continue to rest and hydrate.  If symptoms worsen or fail to improve he should return to urgent care.  Final Clinical Impressions(s) / UC Diagnoses   Final diagnoses:  None   Discharge Instructions   None    ED Prescriptions   None    PDMP not reviewed this encounter.   Landis Martins, [place] 10/15/22 1702

## 2022-10-15 NOTE — Discharge Instructions (Addendum)
You have been swabbed for COVID, and the test will result in the next 24 hours. Our staff will call you if positive. If the COVID test is positive, you should quarantine until you are fever free for 24 hours and you are starting to feel better, and then take added precautions for the next 5 days, such as physical distancing/wearing a mask and good hand hygiene/washing.   Rest and stay hydrated.   Return to urgent care if symptoms worsen or fail to resolve.

## 2022-10-16 LAB — SARS CORONAVIRUS 2 (TAT 6-24 HRS): SARS Coronavirus 2: NEGATIVE

## 2023-08-10 ENCOUNTER — Ambulatory Visit (HOSPITAL_COMMUNITY)
Admission: EM | Admit: 2023-08-10 | Discharge: 2023-08-10 | Disposition: A | Discharge status: Home / Self Care | Attending: Family Medicine | Admitting: Family Medicine

## 2023-08-10 ENCOUNTER — Encounter (HOSPITAL_COMMUNITY): Payer: Self-pay | Admitting: *Deleted

## 2023-08-10 DIAGNOSIS — J069 Acute upper respiratory infection, unspecified: Secondary | ICD-10-CM

## 2023-08-10 HISTORY — DX: Benign neoplasm of cerebral meninges: D32.0

## 2023-08-10 HISTORY — DX: Unspecified convulsions: R56.9

## 2023-08-10 LAB — POC COVID19/FLU A&B COMBO
Covid Antigen, POC: NEGATIVE
Influenza A Antigen, POC: NEGATIVE
Influenza B Antigen, POC: NEGATIVE

## 2023-08-10 MED ORDER — HYDROCODONE BIT-HOMATROP MBR 5-1.5 MG/5ML PO SOLN: 5.0000 mL | Freq: Four times a day (QID) | ORAL | 0 refills | Status: AC | PRN

## 2023-08-10 NOTE — ED Triage Notes (Signed)
 Pt states he has cough and cold sweats X 3 days. He has taken theraflu and cough drops.

## 2023-08-10 NOTE — Discharge Instructions (Signed)
 Be aware, your cough medication may cause drowsiness. Please do not drive, operate heavy machinery or make important decisions while on this medication, it can cloud your judgement.

## 2023-08-10 NOTE — ED Provider Notes (Signed)
 [facility]   [id number] 08/10/23 Arrival Time: 0801  ASSESSMENT & PLAN:  1. Viral URI with cough    Discussed typical duration of likely viral illness. Results for orders placed or performed during the hospital encounter of 08/10/23  POC Covid19/Flu A&B Antigen   Collection Time: 08/10/23  8:53 AM  Result Value Ref Range   Influenza A Antigen, POC Negative Negative   Influenza B Antigen, POC Negative Negative   Covid Antigen, POC Negative Negative   OTC symptom care as needed. Work note provided.  Meds ordered this encounter  Medications   HYDROcodone  bit-homatropine (HYCODAN) 5-1.5 MG/5ML syrup    Sig: Take 5 mLs by mouth every 6 (six) hours as needed for cough.    Dispense:  90 mL    Refill:  0     Follow-up Information     Von Erm, MD.   Specialty: Internal Medicine Why: If worsening or failing to improve as anticipated. Contact information: [address] [place] [address] [phone]                 Reviewed expectations re: course of current medical issues. Questions answered. Outlined signs and symptoms indicating need for more acute intervention. Understanding verbalized. After Visit Summary given.   SUBJECTIVE: History from: Patient. Jeremy Osborn is a 58 y.o. male. Pt states he has cough and cold sweats X 3 days. He has taken theraflu and cough drops.  Denies: fever. Normal PO intake without n/v/d.  OBJECTIVE:  Vitals:   08/10/23 0819  BP: (!) 152/77  Pulse: (!) 49  Resp: 16  Temp: 98.2 F (36.8 C)  TempSrc: Oral  SpO2: 97%    General appearance: alert; no distress Eyes: PERRLA; EOMI; conjunctiva normal HENT: [place]; AT; with nasal congestion Neck: supple  Lungs: speaks full sentences without difficulty; unlabored; CTAB; active cough Extremities: no edema Skin: warm and dry Neurologic: normal gait Psychological: alert and cooperative; normal mood and affect  Labs: Results for orders placed or  performed during the hospital encounter of 08/10/23  POC Covid19/Flu A&B Antigen   Collection Time: 08/10/23  8:53 AM  Result Value Ref Range   Influenza A Antigen, POC Negative Negative   Influenza B Antigen, POC Negative Negative   Covid Antigen, POC Negative Negative   Labs Reviewed  POC COVID19/FLU A&B COMBO    Imaging: No results found.  No Known Allergies  Past Medical History:  Diagnosis Date   Asthma    Benign neoplasm of cerebral meninges (HCC)    Diabetes mellitus without complication (HCC)    Hyperlipidemia    Seizures (HCC)    Social History   Socioeconomic History   Marital status: Single    Spouse name: Not on file   Number of children: Not on file   Years of education: Not on file   Highest education level: Not on file  Occupational History   Not on file  Tobacco Use   Smoking status: Never   Smokeless tobacco: Never  Vaping Use   Vaping status: Never Used  Substance and Sexual Activity   Alcohol use: No   Drug use: No   Sexual activity: Not Currently  Other Topics Concern   Not on file  Social History Narrative   Not on file   Social Drivers of Health   Financial Resource Strain: Not on file  Food Insecurity: Low Risk  (04/19/2023)   Received from [facility]   Hunger Vital Sign  Within the past 12 months, you worried that your food would run out before you got money to buy more: Never true    Within the past 12 months, the food you bought just didn't last and you didn't have money to get more. : Never true  Transportation Needs: No Transportation Needs (04/19/2023)   Received from [facility]    In the past 12 months, has lack of reliable transportation kept you from medical appointments, meetings, work or from getting things needed for daily living? : No  Physical Activity: Not on file  Stress: Not on file  Social Connections: Not on file  Intimate Partner Violence: Not on file   Family History  Problem Relation  Age of Onset   Diabetes Mother    Diabetes Father    Past Surgical History:  Procedure Laterality Date   brain tumor surgery       Rolinda Rogue, MD 08/10/23 [phone]

## 2023-08-11 ENCOUNTER — Emergency Department (HOSPITAL_BASED_OUTPATIENT_CLINIC_OR_DEPARTMENT_OTHER)
Admission: EM | Admit: 2023-08-11 | Discharge: 2023-08-11 | Disposition: A | Discharge status: Home / Self Care | Attending: Emergency Medicine | Admitting: Emergency Medicine

## 2023-08-11 ENCOUNTER — Emergency Department (HOSPITAL_BASED_OUTPATIENT_CLINIC_OR_DEPARTMENT_OTHER): Admitting: Radiology

## 2023-08-11 ENCOUNTER — Encounter (HOSPITAL_BASED_OUTPATIENT_CLINIC_OR_DEPARTMENT_OTHER): Payer: Self-pay

## 2023-08-11 DIAGNOSIS — Z7982 Long term (current) use of aspirin: Secondary | ICD-10-CM | POA: Insufficient documentation

## 2023-08-11 DIAGNOSIS — J45909 Unspecified asthma, uncomplicated: Secondary | ICD-10-CM | POA: Diagnosis not present

## 2023-08-11 DIAGNOSIS — J069 Acute upper respiratory infection, unspecified: Secondary | ICD-10-CM | POA: Insufficient documentation

## 2023-08-11 DIAGNOSIS — Z7984 Long term (current) use of oral hypoglycemic drugs: Secondary | ICD-10-CM | POA: Insufficient documentation

## 2023-08-11 DIAGNOSIS — R0602 Shortness of breath: Secondary | ICD-10-CM | POA: Diagnosis present

## 2023-08-11 DIAGNOSIS — Z79899 Other long term (current) drug therapy: Secondary | ICD-10-CM | POA: Diagnosis not present

## 2023-08-11 LAB — BASIC METABOLIC PANEL WITH GFR
Anion gap: 13 (ref 5–15)
BUN: 12 mg/dL (ref 6–20)
CO2: 23 mmol/L (ref 22–32)
Calcium: 8.8 mg/dL — ABNORMAL LOW (ref 8.9–10.3)
Chloride: 100 mmol/L (ref 98–111)
Creatinine, Ser: 0.99 mg/dL (ref 0.61–1.24)
GFR, Estimated: >60 mL/min (ref >60)
Glucose, Bld: 147 mg/dL — ABNORMAL HIGH (ref 70–99)
Potassium: 4.2 mmol/L (ref 3.5–5.1)
Sodium: 135 mmol/L (ref 135–145)

## 2023-08-11 LAB — PRO BRAIN NATRIURETIC PEPTIDE: Pro Brain Natriuretic Peptide: <36 pg/mL (ref <300.0)

## 2023-08-11 LAB — CBC
HCT: 38.1 % — ABNORMAL LOW (ref 39.0–52.0)
Hemoglobin: 12.4 g/dL — ABNORMAL LOW (ref 13.0–17.0)
MCH: 28.8 pg (ref 26.0–34.0)
MCHC: 32.5 g/dL (ref 30.0–36.0)
MCV: 88.4 fL (ref 80.0–100.0)
Platelets: 217 K/uL (ref 150–400)
RBC: 4.31 MIL/uL (ref 4.22–5.81)
RDW: 13.6 % (ref 11.5–15.5)
WBC: 5.8 K/uL (ref 4.0–10.5)
nRBC: 0 % (ref 0.0–0.2)

## 2023-08-11 MED ORDER — SODIUM CHLORIDE 3 % IN NEBU
4.0000 mL | INHALATION_SOLUTION | Freq: Every day | RESPIRATORY_TRACT | Status: DC
  Administered 2023-08-11: 4 mL via RESPIRATORY_TRACT
  Filled 2023-08-11: qty 4

## 2023-08-11 NOTE — ED Triage Notes (Signed)
 Pt was seen at [facility] yesterday dx with a cold and given cough medicine.   States the cough med did help but feels he can not breathe when lying down.   Woke up coughing and coughed up a clot of blood Pt has hx of bradycardia

## 2023-08-11 NOTE — ED Notes (Signed)
 Pulse oximetry measured while ambulating, patient maintained SP02 94-98% during ambulation.  Patient denies shortness of breath, dizziness, coughing.
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
## 2023-08-11 NOTE — Discharge Instructions (Signed)
 You were seen today for shortness of breath.  Your chest x-ray does not show any evidence of pneumonia.  Use a humidifier in your room to break up mucus.  You may use an expectorant such as Mucinex .
